# Patient Record
Sex: Female | Born: 1985
Health system: Southern US, Community
[De-identification: ages and names within clinical notes are randomized; demographics above are authoritative.]

## PROBLEM LIST (undated history)

## (undated) ENCOUNTER — Inpatient Hospital Stay (HOSPITAL_COMMUNITY): Payer: Self-pay

## (undated) DIAGNOSIS — Z6841 Body Mass Index (BMI) 40.0 and over, adult: Secondary | ICD-10-CM

## (undated) DIAGNOSIS — E282 Polycystic ovarian syndrome: Secondary | ICD-10-CM

## (undated) DIAGNOSIS — R51 Headache: Secondary | ICD-10-CM

## (undated) DIAGNOSIS — Z87442 Personal history of urinary calculi: Secondary | ICD-10-CM

## (undated) DIAGNOSIS — K219 Gastro-esophageal reflux disease without esophagitis: Secondary | ICD-10-CM

## (undated) DIAGNOSIS — K429 Umbilical hernia without obstruction or gangrene: Secondary | ICD-10-CM

## (undated) DIAGNOSIS — O24419 Gestational diabetes mellitus in pregnancy, unspecified control: Secondary | ICD-10-CM

## (undated) DIAGNOSIS — D649 Anemia, unspecified: Secondary | ICD-10-CM

## (undated) DIAGNOSIS — R519 Headache, unspecified: Secondary | ICD-10-CM

## (undated) DIAGNOSIS — Z8619 Personal history of other infectious and parasitic diseases: Secondary | ICD-10-CM

## (undated) DIAGNOSIS — M419 Scoliosis, unspecified: Secondary | ICD-10-CM

## (undated) DIAGNOSIS — Z8489 Family history of other specified conditions: Secondary | ICD-10-CM

## (undated) HISTORY — DX: Polycystic ovarian syndrome: E28.2

## (undated) HISTORY — DX: Personal history of other infectious and parasitic diseases: Z86.19

## (undated) HISTORY — DX: Gestational diabetes mellitus in pregnancy, unspecified control: O24.419

## (undated) HISTORY — PX: TONSILLECTOMY: SUR1361

## (undated) HISTORY — PX: WISDOM TOOTH EXTRACTION: SHX21

## (undated) HISTORY — DX: Morbid (severe) obesity due to excess calories: E66.01

## (undated) HISTORY — DX: Headache: R51

## (undated) HISTORY — DX: Headache, unspecified: R51.9

## (undated) HISTORY — DX: Scoliosis, unspecified: M41.9

## (undated) HISTORY — DX: Body Mass Index (BMI) 40.0 and over, adult: Z684

---

## 2015-03-20 LAB — OB RESULTS CONSOLE RUBELLA ANTIBODY, IGM: RUBELLA: IMMUNE

## 2015-03-20 LAB — OB RESULTS CONSOLE HEPATITIS B SURFACE ANTIGEN: Hepatitis B Surface Ag: NEGATIVE

## 2015-03-20 LAB — OB RESULTS CONSOLE ABO/RH: RH Type: POSITIVE

## 2015-03-20 LAB — OB RESULTS CONSOLE HIV ANTIBODY (ROUTINE TESTING): HIV: NONREACTIVE

## 2015-03-20 LAB — OB RESULTS CONSOLE GC/CHLAMYDIA
CHLAMYDIA, DNA PROBE: NEGATIVE
GC PROBE AMP, GENITAL: NEGATIVE

## 2015-03-20 LAB — OB RESULTS CONSOLE RPR: RPR: NONREACTIVE

## 2015-04-04 ENCOUNTER — Encounter (HOSPITAL_COMMUNITY): Payer: Self-pay | Admitting: *Deleted

## 2015-04-04 ENCOUNTER — Inpatient Hospital Stay (HOSPITAL_COMMUNITY)
Admission: AD | Admit: 2015-04-04 | Discharge: 2015-04-04 | Disposition: A | Discharge status: Home / Self Care | Payer: BLUE CROSS/BLUE SHIELD | Source: Ambulatory Visit | Attending: Obstetrics and Gynecology | Admitting: Obstetrics and Gynecology

## 2015-04-04 ENCOUNTER — Inpatient Hospital Stay (HOSPITAL_COMMUNITY): Payer: BLUE CROSS/BLUE SHIELD

## 2015-04-04 DIAGNOSIS — R55 Syncope and collapse: Secondary | ICD-10-CM | POA: Insufficient documentation

## 2015-04-04 DIAGNOSIS — R109 Unspecified abdominal pain: Secondary | ICD-10-CM

## 2015-04-04 DIAGNOSIS — Z3A09 9 weeks gestation of pregnancy: Secondary | ICD-10-CM | POA: Insufficient documentation

## 2015-04-04 DIAGNOSIS — R1031 Right lower quadrant pain: Secondary | ICD-10-CM | POA: Insufficient documentation

## 2015-04-04 DIAGNOSIS — O26891 Other specified pregnancy related conditions, first trimester: Secondary | ICD-10-CM | POA: Insufficient documentation

## 2015-04-04 DIAGNOSIS — O210 Mild hyperemesis gravidarum: Secondary | ICD-10-CM | POA: Diagnosis not present

## 2015-04-04 LAB — CBC
HCT: 41 % (ref 36.0–46.0)
Hemoglobin: 13.5 g/dL (ref 12.0–15.0)
MCH: 28.1 pg (ref 26.0–34.0)
MCHC: 32.9 g/dL (ref 30.0–36.0)
MCV: 85.4 fL (ref 78.0–100.0)
Platelets: 338 10*3/uL (ref 150–400)
RBC: 4.8 MIL/uL (ref 3.87–5.11)
RDW: 13.7 % (ref 11.5–15.5)
WBC: 7.5 10*3/uL (ref 4.0–10.5)

## 2015-04-04 LAB — COMPREHENSIVE METABOLIC PANEL
ALBUMIN: 3.8 g/dL (ref 3.5–5.0)
ALK PHOS: 66 U/L (ref 38–126)
ALT: 15 U/L (ref 14–54)
AST: 20 U/L (ref 15–41)
Anion gap: 11 (ref 5–15)
BILIRUBIN TOTAL: 0.4 mg/dL (ref 0.3–1.2)
BUN: 9 mg/dL (ref 6–20)
CO2: 22 mmol/L (ref 22–32)
CREATININE: 0.7 mg/dL (ref 0.44–1.00)
Calcium: 8.8 mg/dL — ABNORMAL LOW (ref 8.9–10.3)
Chloride: 101 mmol/L (ref 101–111)
GFR calc Af Amer: >60 mL/min (ref >60)
GLUCOSE: 106 mg/dL — AB (ref 65–99)
POTASSIUM: 3.4 mmol/L — AB (ref 3.5–5.1)
Sodium: 134 mmol/L — ABNORMAL LOW (ref 135–145)
TOTAL PROTEIN: 7.3 g/dL (ref 6.5–8.1)

## 2015-04-04 LAB — URINALYSIS, ROUTINE W REFLEX MICROSCOPIC
Bilirubin Urine: NEGATIVE
Glucose, UA: NEGATIVE mg/dL
Hgb urine dipstick: NEGATIVE
Ketones, ur: NEGATIVE mg/dL
LEUKOCYTES UA: NEGATIVE
Nitrite: NEGATIVE
PROTEIN: NEGATIVE mg/dL
pH: 6 (ref 5.0–8.0)

## 2015-04-04 LAB — ABO/RH: ABO/RH(D): A POS

## 2015-04-04 LAB — HCG, QUANTITATIVE, PREGNANCY: HCG, BETA CHAIN, QUANT, S: 54078 m[IU]/mL — AB (ref <5)

## 2015-04-04 LAB — TYPE AND SCREEN
ABO/RH(D): A POS
Antibody Screen: NEGATIVE

## 2015-04-04 MED ORDER — MORPHINE SULFATE (PF) 4 MG/ML IV SOLN
INTRAVENOUS | Status: AC
  Filled 2015-04-04: qty 1

## 2015-04-04 MED ORDER — LACTATED RINGERS IV BOLUS (SEPSIS)
500.0000 mL | Freq: Once | INTRAVENOUS | Status: AC
  Administered 2015-04-04: 500 mL via INTRAVENOUS

## 2015-04-04 MED ORDER — MORPHINE SULFATE (PF) 4 MG/ML IV SOLN
4.0000 mg | Freq: Once | INTRAVENOUS | Status: AC
  Administered 2015-04-04: 4 mg via INTRAVENOUS

## 2015-04-04 NOTE — Discharge Instructions (Signed)
First Trimester of Pregnancy The first trimester of pregnancy is from week 1 until the end of week 12 (months 1 through 3). A week after a sperm fertilizes an egg, the egg will implant on the wall of the uterus. This embryo will begin to develop into a baby. Genes from you and your partner are forming the baby. The female genes determine whether the baby is a boy or a girl. At 6-8 weeks, the eyes and face are formed, and the heartbeat can be seen on ultrasound. At the end of 12 weeks, all the baby's organs are formed.  Now that you are pregnant, you will want to do everything you can to have a healthy baby. Two of the most important things are to get good prenatal care and to follow your health care provider's instructions. Prenatal care is all the medical care you receive before the baby's birth. This care will help prevent, find, and treat any problems during the pregnancy and childbirth. BODY CHANGES Your body goes through many changes during pregnancy. The changes vary from woman to woman.   You may gain or lose a couple of pounds at first.  You may feel sick to your stomach (nauseous) and throw up (vomit). If the vomiting is uncontrollable, call your health care provider.  You may tire easily.  You may develop headaches that can be relieved by medicines approved by your health care provider.  You may urinate more often. Painful urination may mean you have a bladder infection.  You may develop heartburn as a result of your pregnancy.  You may develop constipation because certain hormones are causing the muscles that push waste through your intestines to slow down.  You may develop hemorrhoids or swollen, bulging veins (varicose veins).  Your breasts may begin to grow larger and become tender. Your nipples may stick out more, and the tissue that surrounds them (areola) may become darker.  Your gums may bleed and may be sensitive to brushing and flossing.  Dark spots or blotches (chloasma,  mask of pregnancy) may develop on your face. This will likely fade after the baby is born.  Your menstrual periods will stop.  You may have a loss of appetite.  You may develop cravings for certain kinds of food.  You may have changes in your emotions from day to day, such as being excited to be pregnant or being concerned that something may go wrong with the pregnancy and baby.  You may have more vivid and strange dreams.  You may have changes in your hair. These can include thickening of your hair, rapid growth, and changes in texture. Some women also have hair loss during or after pregnancy, or hair that feels dry or thin. Your hair will most likely return to normal after your baby is born. WHAT TO EXPECT AT YOUR PRENATAL VISITS During a routine prenatal visit:  You will be weighed to make sure you and the baby are growing normally.  Your blood pressure will be taken.  Your abdomen will be measured to track your baby's growth.  The fetal heartbeat will be listened to starting around week 10 or 12 of your pregnancy.  Test results from any previous visits will be discussed. Your health care provider may ask you:  How you are feeling.  If you are feeling the baby move.  If you have had any abnormal symptoms, such as leaking fluid, bleeding, severe headaches, or abdominal cramping.  If you are using any tobacco products,   including cigarettes, chewing tobacco, and electronic cigarettes.  If you have any questions. Other tests that may be performed during your first trimester include:  Blood tests to find your blood type and to check for the presence of any previous infections. They will also be used to check for low iron levels (anemia) and Rh antibodies. Later in the pregnancy, blood tests for diabetes will be done along with other tests if problems develop.  Urine tests to check for infections, diabetes, or protein in the urine.  An ultrasound to confirm the proper growth  and development of the baby.  An amniocentesis to check for possible genetic problems.  Fetal screens for spina bifida and Down syndrome.  You may need other tests to make sure you and the baby are doing well.  HIV (human immunodeficiency virus) testing. Routine prenatal testing includes screening for HIV, unless you choose not to have this test. HOME CARE INSTRUCTIONS  Medicines  Follow your health care provider's instructions regarding medicine use. Specific medicines may be either safe or unsafe to take during pregnancy.  Take your prenatal vitamins as directed.  If you develop constipation, try taking a stool softener if your health care provider approves. Diet  Eat regular, well-balanced meals. Choose a variety of foods, such as meat or vegetable-based protein, fish, milk and low-fat dairy products, vegetables, fruits, and whole grain breads and cereals. Your health care provider will help you determine the amount of weight gain that is right for you.  Avoid raw meat and uncooked cheese. These carry germs that can cause birth defects in the baby.  Eating four or five small meals rather than three large meals a day may help relieve nausea and vomiting. If you start to feel nauseous, eating a few soda crackers can be helpful. Drinking liquids between meals instead of during meals also seems to help nausea and vomiting.  If you develop constipation, eat more high-fiber foods, such as fresh vegetables or fruit and whole grains. Drink enough fluids to keep your urine clear or pale yellow. Activity and Exercise  Exercise only as directed by your health care provider. Exercising will help you:  Control your weight.  Stay in shape.  Be prepared for labor and delivery.  Experiencing pain or cramping in the lower abdomen or low back is a good sign that you should stop exercising. Check with your health care provider before continuing normal exercises.  Try to avoid standing for long  periods of time. Move your legs often if you must stand in one place for a long time.  Avoid heavy lifting.  Wear low-heeled shoes, and practice good posture.  You may continue to have sex unless your health care provider directs you otherwise. Relief of Pain or Discomfort  Wear a good support bra for breast tenderness.   Take warm sitz baths to soothe any pain or discomfort caused by hemorrhoids. Use hemorrhoid cream if your health care provider approves.   Rest with your legs elevated if you have leg cramps or low back pain.  If you develop varicose veins in your legs, wear support hose. Elevate your feet for 15 minutes, 3-4 times a day. Limit salt in your diet. Prenatal Care  Schedule your prenatal visits by the twelfth week of pregnancy. They are usually scheduled monthly at first, then more often in the last 2 months before delivery.  Write down your questions. Take them to your prenatal visits.  Keep all your prenatal visits as directed by your   health care provider. Safety  Wear your seat belt at all times when driving.  Make a list of emergency phone numbers, including numbers for family, friends, the hospital, and police and fire departments. General Tips  Ask your health care provider for a referral to a local prenatal education class. Begin classes no later than at the beginning of month 6 of your pregnancy.  Ask for help if you have counseling or nutritional needs during pregnancy. Your health care provider can offer advice or refer you to specialists for help with various needs.  Do not use hot tubs, steam rooms, or saunas.  Do not douche or use tampons or scented sanitary pads.  Do not cross your legs for long periods of time.  Avoid cat litter boxes and soil used by cats. These carry germs that can cause birth defects in the baby and possibly loss of the fetus by miscarriage or stillbirth.  Avoid all smoking, herbs, alcohol, and medicines not prescribed by  your health care provider. Chemicals in these affect the formation and growth of the baby.  Do not use any tobacco products, including cigarettes, chewing tobacco, and electronic cigarettes. If you need help quitting, ask your health care provider. You may receive counseling support and other resources to help you quit.  Schedule a dentist appointment. At home, brush your teeth with a soft toothbrush and be gentle when you floss. SEEK MEDICAL CARE IF:   You have dizziness.  You have mild pelvic cramps, pelvic pressure, or nagging pain in the abdominal area.  You have persistent nausea, vomiting, or diarrhea.  You have a bad smelling vaginal discharge.  You have pain with urination.  You notice increased swelling in your face, hands, legs, or ankles. SEEK IMMEDIATE MEDICAL CARE IF:   You have a fever.  You are leaking fluid from your vagina.  You have spotting or bleeding from your vagina.  You have severe abdominal cramping or pain.  You have rapid weight gain or loss.  You vomit blood or material that looks like coffee grounds.  You are exposed to German measles and have never had them.  You are exposed to fifth disease or chickenpox.  You develop a severe headache.  You have shortness of breath.  You have any kind of trauma, such as from a fall or a car accident.   This information is not intended to replace advice given to you by your health care provider. Make sure you discuss any questions you have with your health care provider.   Document Released: 03/31/2001 Document Revised: 04/27/2014 Document Reviewed: 02/14/2013 Elsevier Interactive Patient Education 2016 [employer]  

## 2015-04-04 NOTE — Progress Notes (Signed)
Notified of pt lab results and u/s result. Will come see pt

## 2015-04-04 NOTE — MAU Note (Addendum)
Thinks she is having appendix pain and needs to be checked out for umbilical hernia.  Has been having RLQ pain for the past 8 months prior to the preg.  Never had either dx.  Ongoing vomiting.  Had 2nd prenatal appointment today

## 2015-04-04 NOTE — MAU Provider Note (Signed)
History    Marie Flores is a 29y.o. G2P1001 at 9.5wks who presents, from office, for right abdominal pain.  Patient reports pain started yesterday and is intermittent, but does not radiate.  Patient reports diarrhea this afternoon as well as nausea and vomiting prior to arrival.  Patient states she has not taken anything for pain or N/V, but was given samples of Diclegis this morning. Patient states that she has suspected umbilical hernia, but stopped treatment with her PCP when she found out she was pregnant and was not formally diagnosed.  Patient reports pain is exaggerated with eating and relieved with laying on her left side.  Patient reports she last ate at 1330 yesterday and pain began a couple hours after.  Patient reports "slight fever" last night of 99 and did not treat it.  Patient denies contact with sick persons.    There are no active problems to display for this patient.   Chief Complaint  Patient presents with  . Abdominal Pain  . Morning Sickness   HPI  OB History    Gravida Para Term Preterm AB TAB SAB Ectopic Multiple Living   History reviewed. No pertinent past medical history.  Past Surgical History  Procedure Laterality Date  . Tonsillectomy    . Wisdom tooth extraction      History reviewed. No pertinent family history.  Social History  Substance Use Topics  . Smoking status: None  . Smokeless tobacco: None  . Alcohol Use: None    Allergies: No Known Allergies  No prescriptions prior to admission    ROS  See HPI Above Physical Exam   Blood pressure 120/60, pulse 99, temperature 98.4 F (36.9 C), temperature source Oral, resp. rate 20, height  (1.575 m), weight 95.709 kg (211 lb), last menstrual period 01/26/2015.  Results for orders placed or performed during the hospital encounter of 04/04/15 (from the past 24 hour(s))  Urinalysis, Routine w reflex microscopic (not at [facility])     Status: Abnormal   Collection  Time: 04/04/15  6:40 PM  Result Value Ref Range   Color, Urine YELLOW YELLOW   APPearance CLEAR CLEAR   Specific Gravity, Urine <1.005 (L) 1.005 - 1.030   pH 6.0 5.0 - 8.0   Glucose, UA NEGATIVE NEGATIVE mg/dL   Hgb urine dipstick NEGATIVE NEGATIVE   Bilirubin Urine NEGATIVE NEGATIVE   Ketones, ur NEGATIVE NEGATIVE mg/dL   Protein, ur NEGATIVE NEGATIVE mg/dL   Nitrite NEGATIVE NEGATIVE   Leukocytes, UA NEGATIVE NEGATIVE  CBC     Status: None   Collection Time: 04/04/15  7:52 PM  Result Value Ref Range   WBC 7.5 4.0 - 10.5 K/uL   RBC 4.80 3.87 - 5.11 MIL/uL   Hemoglobin 13.5 12.0 - 15.0 g/dL   HCT 16.1 09.6 - 04.5 %   MCV 85.4 78.0 - 100.0 fL   MCH 28.1 26.0 - 34.0 pg   MCHC 32.9 30.0 - 36.0 g/dL   RDW 40.9 81.1 - 91.4 %   Platelets 338 150 - 400 K/uL  Comprehensive metabolic panel     Status: Abnormal   Collection Time: 04/04/15  7:52 PM  Result Value Ref Range   Sodium 134 (L) 135 - 145 mmol/L   Potassium 3.4 (L) 3.5 - 5.1 mmol/L   Chloride 101 101 - 111 mmol/L   CO2 22 22 - 32 mmol/L   Glucose, Bld  106 (H) 65 - 99 mg/dL   BUN 9 6 - 20 mg/dL   Creatinine, Ser 4.690.70 0.44 - 1.00 mg/dL   Calcium 8.8 (L) 8.9 - 10.3 mg/dL   Total Protein 7.3 6.5 - 8.1 g/dL   Albumin 3.8 3.5 - 5.0 g/dL   AST 20 15 - 41 U/L   ALT 15 14 - 54 U/L   Alkaline Phosphatase 66 38 - 126 U/L   Total Bilirubin 0.4 0.3 - 1.2 mg/dL   GFR calc non Af Amer >60 >60 mL/min   GFR calc Af Amer >60 >60 mL/min   Anion gap 11 5 - 15  hCG, quantitative, pregnancy     Status: Abnormal   Collection Time: 04/04/15  7:52 PM  Result Value Ref Range   hCG, Beta Chain, Quant, S 54078 (H) <5 mIU/mL  Type and screen     Status: None   Collection Time: 04/04/15  7:52 PM  Result Value Ref Range   ABO/RH(D) A POS    Antibody Screen NEG    Sample Expiration 04/07/2015   ABO/Rh     Status: None   Collection Time: 04/04/15  7:52 PM  Result Value Ref Range   ABO/RH(D) A POS     Physical Exam  Vitals  reviewed. Constitutional: She is oriented to person, place, and time. She appears well-developed and well-nourished.  HENT:  Head: Normocephalic and atraumatic.  Eyes: EOM are normal.  Neck: Normal range of motion.  Cardiovascular: Normal rate.   Respiratory: Effort normal.  GI: Soft. She exhibits no distension. There is tenderness. There is no rebound and no guarding.  Small (<2cm) umbilical mass that is easily reducible.  No pain elicited.    Musculoskeletal: Normal range of motion. She exhibits no edema.  Neurological: She is alert and oriented to person, place, and time.  Skin: Skin is warm. She is diaphoretic.     FHR: 189 by US UC: None ED Course  Assessment: IUP at 9.5wks Right Side Abdominal Pain Syncope  Plan: -Start IV and draw labs -Dr. Katharine LookJ. Ozan consulted and advised -Labs: CBC, CMP, UA, and HcG -US to confirm IUP -Morphine IV 4mg  now  Follow Up (2155) -US confirms SIUP consistent with LMP, No SCH, Ovaries WNL, FHR 189 -Patient eating crackers and drinking fluids that she brought from home -Dr. Katharine LookJ. Ozan consulted and advised discharge -Patient informed of results -Questions and concerns addressed--informed that questionable hernia is small/mild and will be observed -Further informed that labs insignificant for infection including appendicitis. -Follow up in office to be scheduled  -Encouraged to call if any questions or concerns arise prior to next scheduled office visit.  -Discharged to home in improved condition   Cherre RobinsJessica L Aubreyana Saltz CNM, MSN 04/04/2015 8:47 PM

## 2015-04-04 NOTE — MAU Note (Signed)
PT  WAS IN LOBBY   WAITING  FOR    ROOM-  HAD  SUDDEN  PAIN -  THEN FELT  WEAK  AND  PASSED OUT  IN LOBBY  CHAIR.     WITH  AMONIA  -  ALERT-  BROUGHT   TO RM  10.

## 2015-04-04 NOTE — MAU Note (Signed)
Urine in lab 

## 2015-04-21 NOTE — L&D Delivery Note (Signed)
Delivery Note  At 11:04 AM Flores viable female named Marie Flores was delivered via Vaginal, Spontaneous Delivery (Presentation: ; Occiput Anterior) [facility] APGAR: 9, 9  Placenta status: Intact, Spontaneous.  Cord: 3 vessels  Anesthesia: Epidural  Episiotomy: None Lacerations: 2nd degree Suture Repair: 3.0 Monocryl Est. Blood Loss (mL): 400  Mom to postpartum.  Baby to Couplet care / Skin to Skin.  Marie Flores 11/02/2015, 12:11 PM

## 2015-06-21 ENCOUNTER — Other Ambulatory Visit (HOSPITAL_COMMUNITY): Payer: Self-pay | Admitting: Obstetrics and Gynecology

## 2015-06-21 DIAGNOSIS — R1011 Right upper quadrant pain: Secondary | ICD-10-CM

## 2015-06-24 ENCOUNTER — Ambulatory Visit (HOSPITAL_COMMUNITY)
Admission: RE | Admit: 2015-06-24 | Discharge: 2015-06-24 | Disposition: A | Discharge status: Home / Self Care | Payer: BLUE CROSS/BLUE SHIELD | Source: Ambulatory Visit | Attending: Obstetrics and Gynecology | Admitting: Obstetrics and Gynecology

## 2015-06-24 DIAGNOSIS — R1011 Right upper quadrant pain: Secondary | ICD-10-CM | POA: Insufficient documentation

## 2015-09-02 ENCOUNTER — Encounter: Payer: BLUE CROSS/BLUE SHIELD | Attending: Advanced Practice Midwife | Admitting: Skilled Nursing Facility1

## 2015-09-02 VITALS — Ht 62.0 in | Wt 232.0 lb

## 2015-09-02 DIAGNOSIS — O24419 Gestational diabetes mellitus in pregnancy, unspecified control: Secondary | ICD-10-CM | POA: Diagnosis present

## 2015-09-02 DIAGNOSIS — Z3A00 Weeks of gestation of pregnancy not specified: Secondary | ICD-10-CM | POA: Insufficient documentation

## 2015-09-02 DIAGNOSIS — O24410 Gestational diabetes mellitus in pregnancy, diet controlled: Secondary | ICD-10-CM

## 2015-09-02 DIAGNOSIS — O99810 Abnormal glucose complicating pregnancy: Secondary | ICD-10-CM | POA: Diagnosis not present

## 2015-09-04 ENCOUNTER — Encounter: Payer: Self-pay | Admitting: Skilled Nursing Facility1

## 2015-09-04 NOTE — Progress Notes (Signed)
  Patient was seen on 09/02/2015 for Gestational Diabetes self-management class at [facility]. The following learning objectives were met by the patient during this course:   States the definition of Gestational Diabetes  States why dietary management is important in controlling blood glucose  Describes the effects each nutrient has on blood glucose levels  Demonstrates ability to create a balanced meal plan  Demonstrates carbohydrate counting   States when to check blood glucose levels  Demonstrates proper blood glucose monitoring techniques  States the effect of stress and exercise on blood glucose levels  States the importance of limiting caffeine and abstaining from alcohol and smoking  Blood glucose monitor given: One Touch Verio Flex Lot # [id number] X Exp: 06/2016 Blood glucose reading: 85  Patient instructed to monitor glucose levels: FBS: 60 - <90 1 hour: <140 2 hour: <120  *Patient received handouts:  Nutrition Diabetes and Pregnancy  Carbohydrate Counting List  Patient will be seen for follow-up as needed.

## 2015-10-07 LAB — OB RESULTS CONSOLE GBS: STREP GROUP B AG: POSITIVE

## 2015-10-30 ENCOUNTER — Encounter (HOSPITAL_COMMUNITY): Payer: Self-pay | Admitting: *Deleted

## 2015-10-30 ENCOUNTER — Telehealth (HOSPITAL_COMMUNITY): Payer: Self-pay | Admitting: *Deleted

## 2015-10-30 NOTE — Telephone Encounter (Signed)
Preadmission screen  

## 2015-10-31 ENCOUNTER — Other Ambulatory Visit: Payer: Self-pay | Admitting: Obstetrics and Gynecology

## 2015-11-02 ENCOUNTER — Inpatient Hospital Stay (HOSPITAL_COMMUNITY): Payer: BLUE CROSS/BLUE SHIELD | Admitting: Anesthesiology

## 2015-11-02 ENCOUNTER — Encounter (HOSPITAL_COMMUNITY): Payer: Self-pay | Admitting: Emergency Medicine

## 2015-11-02 ENCOUNTER — Inpatient Hospital Stay (HOSPITAL_COMMUNITY)
Admission: AD | Admit: 2015-11-02 | Discharge: 2015-11-04 | DRG: 775 | Disposition: A | Discharge status: Home / Self Care | Payer: BLUE CROSS/BLUE SHIELD | Source: Ambulatory Visit | Attending: Obstetrics and Gynecology | Admitting: Obstetrics and Gynecology

## 2015-11-02 ENCOUNTER — Inpatient Hospital Stay (HOSPITAL_COMMUNITY): Admission: RE | Admit: 2015-11-02 | Payer: BLUE CROSS/BLUE SHIELD | Source: Ambulatory Visit

## 2015-11-02 DIAGNOSIS — M419 Scoliosis, unspecified: Secondary | ICD-10-CM | POA: Diagnosis present

## 2015-11-02 DIAGNOSIS — O99824 Streptococcus B carrier state complicating childbirth: Secondary | ICD-10-CM | POA: Diagnosis present

## 2015-11-02 DIAGNOSIS — Z3A40 40 weeks gestation of pregnancy: Secondary | ICD-10-CM

## 2015-11-02 DIAGNOSIS — K219 Gastro-esophageal reflux disease without esophagitis: Secondary | ICD-10-CM | POA: Diagnosis present

## 2015-11-02 DIAGNOSIS — O9962 Diseases of the digestive system complicating childbirth: Secondary | ICD-10-CM | POA: Diagnosis present

## 2015-11-02 DIAGNOSIS — O24425 Gestational diabetes mellitus in childbirth, controlled by oral hypoglycemic drugs: Secondary | ICD-10-CM | POA: Diagnosis present

## 2015-11-02 DIAGNOSIS — D649 Anemia, unspecified: Secondary | ICD-10-CM | POA: Diagnosis present

## 2015-11-02 DIAGNOSIS — Z880 Allergy status to penicillin: Secondary | ICD-10-CM | POA: Diagnosis not present

## 2015-11-02 DIAGNOSIS — Z6841 Body Mass Index (BMI) 40.0 and over, adult: Secondary | ICD-10-CM

## 2015-11-02 DIAGNOSIS — Z833 Family history of diabetes mellitus: Secondary | ICD-10-CM

## 2015-11-02 DIAGNOSIS — O99214 Obesity complicating childbirth: Secondary | ICD-10-CM | POA: Diagnosis present

## 2015-11-02 DIAGNOSIS — Z823 Family history of stroke: Secondary | ICD-10-CM | POA: Diagnosis not present

## 2015-11-02 DIAGNOSIS — O9989 Other specified diseases and conditions complicating pregnancy, childbirth and the puerperium: Secondary | ICD-10-CM | POA: Diagnosis present

## 2015-11-02 DIAGNOSIS — Z825 Family history of asthma and other chronic lower respiratory diseases: Secondary | ICD-10-CM | POA: Diagnosis not present

## 2015-11-02 DIAGNOSIS — O24419 Gestational diabetes mellitus in pregnancy, unspecified control: Secondary | ICD-10-CM | POA: Diagnosis present

## 2015-11-02 DIAGNOSIS — O9902 Anemia complicating childbirth: Secondary | ICD-10-CM | POA: Diagnosis present

## 2015-11-02 LAB — CBC
HEMATOCRIT: 40.7 % (ref 36.0–46.0)
HEMOGLOBIN: 14 g/dL (ref 12.0–15.0)
MCH: 28.9 pg (ref 26.0–34.0)
MCHC: 34.4 g/dL (ref 30.0–36.0)
MCV: 84.1 fL (ref 78.0–100.0)
Platelets: 275 10*3/uL (ref 150–400)
RBC: 4.84 MIL/uL (ref 3.87–5.11)
RDW: 14.6 % (ref 11.5–15.5)
WBC: 14.5 10*3/uL — AB (ref 4.0–10.5)

## 2015-11-02 LAB — TYPE AND SCREEN
ABO/RH(D): A POS
Antibody Screen: NEGATIVE

## 2015-11-02 LAB — GLUCOSE, CAPILLARY
GLUCOSE-CAPILLARY: 80 mg/dL (ref 65–99)
GLUCOSE-CAPILLARY: 98 mg/dL (ref 65–99)
Glucose-Capillary: 93 mg/dL (ref 65–99)

## 2015-11-02 LAB — RPR: RPR Ser Ql: NONREACTIVE

## 2015-11-02 MED ORDER — PHENYLEPHRINE 40 MCG/ML (10ML) SYRINGE FOR IV PUSH (FOR BLOOD PRESSURE SUPPORT)
80.0000 ug | PREFILLED_SYRINGE | INTRAVENOUS | Status: DC | PRN
  Filled 2015-11-02: qty 5

## 2015-11-02 MED ORDER — MEASLES, MUMPS & RUBELLA VAC ~~LOC~~ INJ: 0.5000 mL | INJECTION | Freq: Once | SUBCUTANEOUS | Status: DC

## 2015-11-02 MED ORDER — BENZOCAINE-MENTHOL 20-0.5 % EX AERO: 1.0000 "application " | INHALATION_SPRAY | CUTANEOUS | Status: DC | PRN

## 2015-11-02 MED ORDER — EPHEDRINE 5 MG/ML INJ
10.0000 mg | INTRAVENOUS | Status: DC | PRN
  Filled 2015-11-02: qty 2

## 2015-11-02 MED ORDER — DIBUCAINE 1 % RE OINT: 1.0000 "application " | TOPICAL_OINTMENT | RECTAL | Status: DC | PRN

## 2015-11-02 MED ORDER — LIDOCAINE HCL (PF) 1 % IJ SOLN
INTRAMUSCULAR | Status: DC | PRN
  Administered 2015-11-02: 6 mL
  Administered 2015-11-02 (×2): 6 mL via EPIDURAL
  Administered 2015-11-02: 4 mL via EPIDURAL
  Administered 2015-11-02: 6 mL via EPIDURAL

## 2015-11-02 MED ORDER — PHENYLEPHRINE 40 MCG/ML (10ML) SYRINGE FOR IV PUSH (FOR BLOOD PRESSURE SUPPORT)
80.0000 ug | PREFILLED_SYRINGE | INTRAVENOUS | Status: DC | PRN
  Filled 2015-11-02: qty 10
  Filled 2015-11-02: qty 5

## 2015-11-02 MED ORDER — MISOPROSTOL 25 MCG QUARTER TABLET
25.0000 ug | ORAL_TABLET | ORAL | Status: DC | PRN
  Filled 2015-11-02: qty 1

## 2015-11-02 MED ORDER — TETANUS-DIPHTH-ACELL PERTUSSIS 5-2.5-18.5 LF-MCG/0.5 IM SUSP: 0.5000 mL | Freq: Once | INTRAMUSCULAR | Status: DC

## 2015-11-02 MED ORDER — SOD CITRATE-CITRIC ACID 500-334 MG/5ML PO SOLN: 30.0000 mL | ORAL | Status: DC | PRN

## 2015-11-02 MED ORDER — TERBUTALINE SULFATE 1 MG/ML IJ SOLN
0.2500 mg | Freq: Once | INTRAMUSCULAR | Status: DC | PRN
  Filled 2015-11-02: qty 1

## 2015-11-02 MED ORDER — ONDANSETRON HCL 4 MG/2ML IJ SOLN: 4.0000 mg | INTRAMUSCULAR | Status: DC | PRN

## 2015-11-02 MED ORDER — MISOPROSTOL 50MCG HALF TABLET
50.0000 ug | ORAL_TABLET | Freq: Once | ORAL | Status: AC
  Administered 2015-11-02: 50 ug via ORAL
  Filled 2015-11-02: qty 0.5

## 2015-11-02 MED ORDER — OXYTOCIN BOLUS FROM INFUSION: 500.0000 mL | INTRAVENOUS | Status: DC

## 2015-11-02 MED ORDER — ZOLPIDEM TARTRATE 5 MG PO TABS
5.0000 mg | ORAL_TABLET | Freq: Every evening | ORAL | Status: DC | PRN
  Filled 2015-11-02: qty 1

## 2015-11-02 MED ORDER — COCONUT OIL OIL
1.0000 | TOPICAL_OIL | Status: DC | PRN
Start: 2015-11-02 — End: 2015-11-04

## 2015-11-02 MED ORDER — IBUPROFEN 600 MG PO TABS
600.0000 mg | ORAL_TABLET | Freq: Four times a day (QID) | ORAL | Status: DC
  Administered 2015-11-02 – 2015-11-04 (×7): 600 mg via ORAL
  Filled 2015-11-02 (×8): qty 1

## 2015-11-02 MED ORDER — LACTATED RINGERS IV SOLN: 500.0000 mL | INTRAVENOUS | Status: DC | PRN

## 2015-11-02 MED ORDER — PRENATAL MULTIVITAMIN CH
1.0000 | ORAL_TABLET | Freq: Every day | ORAL | Status: DC
  Filled 2015-11-02: qty 1

## 2015-11-02 MED ORDER — OXYTOCIN 40 UNITS IN LACTATED RINGERS INFUSION - SIMPLE MED: 1.0000 m[IU]/min | INTRAVENOUS | Status: DC

## 2015-11-02 MED ORDER — DIPHENHYDRAMINE HCL 50 MG/ML IJ SOLN: 12.5000 mg | INTRAMUSCULAR | Status: DC | PRN

## 2015-11-02 MED ORDER — LIDOCAINE HCL (PF) 1 % IJ SOLN
30.0000 mL | INTRAMUSCULAR | Status: AC | PRN
  Administered 2015-11-02: 30 mL via SUBCUTANEOUS
  Filled 2015-11-02: qty 30

## 2015-11-02 MED ORDER — FENTANYL CITRATE (PF) 100 MCG/2ML IJ SOLN
50.0000 ug | INTRAMUSCULAR | Status: DC | PRN
  Administered 2015-11-02: 100 ug via INTRAVENOUS
  Filled 2015-11-02 (×2): qty 2

## 2015-11-02 MED ORDER — SIMETHICONE 80 MG PO CHEW: 80.0000 mg | CHEWABLE_TABLET | ORAL | Status: DC | PRN

## 2015-11-02 MED ORDER — ACETAMINOPHEN 325 MG PO TABS: 650.0000 mg | ORAL_TABLET | ORAL | Status: DC | PRN

## 2015-11-02 MED ORDER — OXYTOCIN 40 UNITS IN LACTATED RINGERS INFUSION - SIMPLE MED
2.5000 [IU]/h | INTRAVENOUS | Status: DC
  Administered 2015-11-02: 2.5 [IU]/h via INTRAVENOUS
  Filled 2015-11-02: qty 1000

## 2015-11-02 MED ORDER — ONDANSETRON HCL 4 MG PO TABS: 4.0000 mg | ORAL_TABLET | ORAL | Status: DC | PRN

## 2015-11-02 MED ORDER — FLEET ENEMA 7-19 GM/118ML RE ENEM: 1.0000 | ENEMA | RECTAL | Status: DC | PRN

## 2015-11-02 MED ORDER — WITCH HAZEL-GLYCERIN EX PADS: 1.0000 "application " | MEDICATED_PAD | CUTANEOUS | Status: DC | PRN

## 2015-11-02 MED ORDER — FERROUS SULFATE 325 (65 FE) MG PO TABS
325.0000 mg | ORAL_TABLET | Freq: Two times a day (BID) | ORAL | Status: DC
  Filled 2015-11-02 (×3): qty 1

## 2015-11-02 MED ORDER — CLINDAMYCIN PHOSPHATE 900 MG/50ML IV SOLN
900.0000 mg | Freq: Three times a day (TID) | INTRAVENOUS | Status: DC
  Administered 2015-11-02: 900 mg via INTRAVENOUS
  Filled 2015-11-02 (×3): qty 50

## 2015-11-02 MED ORDER — ONDANSETRON HCL 4 MG/2ML IJ SOLN: 4.0000 mg | Freq: Four times a day (QID) | INTRAMUSCULAR | Status: DC | PRN

## 2015-11-02 MED ORDER — LACTATED RINGERS IV SOLN: 500.0000 mL | Freq: Once | INTRAVENOUS | Status: DC

## 2015-11-02 MED ORDER — ZOLPIDEM TARTRATE 5 MG PO TABS: 5.0000 mg | ORAL_TABLET | Freq: Every evening | ORAL | Status: DC | PRN

## 2015-11-02 MED ORDER — LACTATED RINGERS IV SOLN
500.0000 mL | Freq: Once | INTRAVENOUS | Status: AC
  Administered 2015-11-02: 500 mL via INTRAVENOUS

## 2015-11-02 MED ORDER — LACTATED RINGERS IV SOLN
INTRAVENOUS | Status: DC
  Administered 2015-11-02: 125 mL/h via INTRAVENOUS
  Administered 2015-11-02: 09:00:00 via INTRAVENOUS

## 2015-11-02 MED ORDER — FENTANYL 2.5 MCG/ML BUPIVACAINE 1/10 % EPIDURAL INFUSION (WH - ANES)
14.0000 mL/h | INTRAMUSCULAR | Status: DC | PRN
  Administered 2015-11-02 (×2): 14 mL/h via EPIDURAL
  Filled 2015-11-02: qty 125

## 2015-11-02 MED ORDER — DIPHENHYDRAMINE HCL 25 MG PO CAPS: 25.0000 mg | ORAL_CAPSULE | Freq: Four times a day (QID) | ORAL | Status: DC | PRN

## 2015-11-02 MED ORDER — SENNOSIDES-DOCUSATE SODIUM 8.6-50 MG PO TABS
2.0000 | ORAL_TABLET | ORAL | Status: DC
  Administered 2015-11-03 (×2): 2 via ORAL
  Filled 2015-11-02 (×2): qty 2

## 2015-11-02 NOTE — Anesthesia Pain Management Evaluation Note (Signed)
  CRNA Pain Management Visit Note  Patient: Marie Flores, 30 y.o., female  "Hello I am a member of the anesthesia team at [facility]. We have an anesthesia team available at all times to provide care throughout the hospital, including epidural management and anesthesia for C-section. I don't know your plan for the delivery whether it a natural birth, water birth, IV sedation, nitrous supplementation, doula or epidural, but we want to meet your pain goals."   1.Was your pain managed to your expectations on prior hospitalizations?   Yes   2.What is your expectation for pain management during this hospitalization?     Epidural  3.How can we help you reach that goal? epidural  Record the patient's initial score and the patient's pain goal.   Pain: 5  Pain Goal: 4 [facility] wants you to be able to say your pain was always managed very well.  Marie Flores 11/02/2015

## 2015-11-02 NOTE — Progress Notes (Signed)
I was called to stand by for delivery as patient had made rapid progress and patient's provider was en route to hospital. When I arrived in the room the baby was crowning. I performed an uncomplicated delivery. OA. No fetal heart rate decelerations noted. Cord clamped and cut after 1 minute. Pitocin started. At that time the patient's provider arrived and assumed care for management of the third stage.

## 2015-11-02 NOTE — Anesthesia Preprocedure Evaluation (Signed)
Anesthesia Evaluation  Patient identified by MRN, date of birth, ID band Patient awake    Reviewed: Allergy & Precautions, NPO status , Patient's Chart, lab work & pertinent test results  History of Anesthesia Complications Negative for: history of anesthetic complications  Airway Mallampati: IV  TM Distance: >3 FB Neck ROM: Full    Dental  (+) Dental Advisory Given   Pulmonary neg pulmonary ROS,    breath sounds clear to auscultation       Cardiovascular negative cardio ROS   Rhythm:Regular Rate:Normal     Neuro/Psych  Headaches,    GI/Hepatic Neg liver ROS, GERD  Poorly Controlled,  Endo/Other  diabetes, GestationalMorbid obesity  Renal/GU negative Renal ROS     Musculoskeletal scoliosis   Abdominal (+) + obese,   Peds  Hematology   Anesthesia Other Findings   Reproductive/Obstetrics (+) Pregnancy                             Anesthesia Physical Anesthesia Plan  ASA: II  Anesthesia Plan: Epidural   Post-op Pain Management:    Induction:   Airway Management Planned: Natural Airway  Additional Equipment:   Intra-op Plan:   Post-operative Plan:   Informed Consent: I have reviewed the patients History and Physical, chart, labs and discussed the procedure including the risks, benefits and alternatives for the proposed anesthesia with the patient or authorized representative who has indicated his/her understanding and acceptance.   Dental advisory given  Plan Discussed with:   Anesthesia Plan Comments: (Patient identified. Risks/Benefits/Options discussed with patient including but not limited to bleeding, infection, nerve damage, paralysis, failed block, incomplete pain control, headache, blood pressure changes, nausea, vomiting, reactions to medication both or allergic, itching and postpartum back pain. Confirmed with bedside nurse the patient's most recent platelet count.  Confirmed with patient that they are not currently taking any anticoagulation, have any bleeding history or any family history of bleeding disorders. Patient expressed understanding and wished to proceed. All questions were answered.  )        Anesthesia Quick Evaluation

## 2015-11-02 NOTE — H&P (Signed)
Sury Wilmer FloorLynn Burdo is a 30 y.o. female presenting for medical induction of labor @ [redacted] weeks gestation due to GDM-medication controlled -Glyburide 5mg  q HS  Pregnancy followed at [facility] since 9  weeks and remarkable for: GDM- medication-Glyburide  EFW- 6lbs 11 oz on 10/15/15  OB History    Gravida Para Term Preterm AB TAB SAB Ectopic Multiple Living   2 1 1       1      Past Medical History  Diagnosis Date  . Headache   . Hx of varicella   . Scoliosis   . Gestational diabetes     diet controlled   Past Surgical History  Procedure Laterality Date  . Tonsillectomy    . Wisdom tooth extraction      Family History:   family history includes Asthma in her mother; Diabetes in her mother; Hyperlipidemia in her father; Stroke in her paternal grandfather. Social History:    reports that she has never smoked. She has never used smokeless tobacco. She reports that she does not drink alcohol or use illicit drugs.   Prenatal labs: ABO, Rh: --/--/A POS, A POS (12/15 1952) Antibody: NEG (12/15 1952) Rubella:Immune RPR: Nonreactive (11/30 0000)  HBsAg: Negative (11/30 0000)  HIV: Non-reactive (11/30 0000)  GBS: Positive (06/19 0000)    Prenatal Transfer Tool  Maternal Diabetes: Yes:  Diabetes Type:  Insulin/Medication controlled Genetic Screening: Declined Maternal Ultrasounds/Referrals:  Fetal Ultrasounds or other Referrals:  None Maternal Substance Abuse:  No Significant Maternal Medications:  None Significant Maternal Lab Results: Lab values include: Group B Strep positive   Dilation: 1.5 Effacement (%): Thick Station: -2 Exam by:: katie forsell,rnc Temperature 98.6 F (37 C), temperature source Oral, height 5' 2.25" (1.581 m), weight 232 lb (105.235 kg), last menstrual period 01/26/2015.  General Appearance: Alert, appropriate appearance for age. No acute distress HEENT Exam: Grossly normal Chest/Respiratory Exam: Normal chest wall and respirations. Clear to  auscultation Cardiovascular Exam: Regular rate and rhythm. S1, S2, no murmur Gastrointestinal Exam: soft, non-tender, Uterus gravid with size compatible with GA, Vertex presentation by Leopold's maneuvers Psychiatric Exam: Alert and oriented, appropriate affect  ++++++++++++++++++++++++++++++++++++++++++++++++++++++++++++++++  Vaginal exam: 1/50/-2 VTX  Fetal tracings: Category 1 strip ++++++++++++++++++++++++++++++++++++++++++++++++++++++++++++++++   Assessment/Plan: Medical Induction- GDM-medication required  IUP @ 40+0 G2P1  Cytotec for cervical ripening Plan for pitocin, AROM Anticipate vaginal delivery   Lori Clemmons CNM 11/02/2015, 2:06 AM

## 2015-11-02 NOTE — Lactation Note (Signed)
This note was copied from a baby's chart. Lactation Consultation Note  Patient Name: Marie Flores JXBJY'NToday's Date: 11/02/2015 Reason for consult: Initial assessment  Baby 6 hours old. Mom reports that the baby nursed well earlier, and she is letting the baby sleep now. Mom states that her older child is about to arrive for a visit. Enc mom to call for assistance with latching as needed. Mom reports that her first child (son) was tongue tied, but she nursed him for 2.5 months. Mom given [facility] brochure, aware of OP/BFSG and LC phone line assistance after D/C.  Maternal Data Has patient been taught Hand Expression?: Yes (Per mom.) Does the patient have breastfeeding experience prior to this delivery?: Yes  Feeding Feeding Type: Breast Fed Length of feed: 20 min  LATCH Score/Interventions                      Lactation Tools Discussed/Used     Consult Status Consult Status: Follow-up Date: 11/03/15 Follow-up type: In-patient    Marie Flores, [name] 11/02/2015, 5:44 PM

## 2015-11-02 NOTE — Progress Notes (Addendum)
Marie Flores is a 30 y.o. G2P1001 at 4040w0dadmitted for induction of labor due to Gestational diabetes.medication controlled  Subjective:  Comfortable with  pt is planning on using pain relief options but has declined them to this point. she is uncomfortable with contractions Contractions every 2 minutes, lasting 30 seconds, intensity 4/10    Objective: BP 136/79 mmHg  Pulse 106  Temp(Src) 98.2 F (36.8 C) (Oral)  Resp 20  Ht 5' 2.25" (1.581 m)  Wt 232 lb (105.235 kg)  BMI 42.10 kg/m2  LMP 01/26/2015      FHT:  FHR: 130 bpm, variability: moderate,  accelerations:  Present,  decelerations:  Absent  SVE:   Dilation: 2 Effacement (%): 60 Station: -2 Exam by:: katie forsell,rnc  Labs: Lab Results  Component Value Date   WBC 14.5* 11/02/2015   HGB 14.0 11/02/2015   HCT 40.7 11/02/2015   MCV 84.1 11/02/2015   PLT 275 11/02/2015    Assessment / Plan: Induction of labor- GDM-Glyburide GBS Positive- Allergic to Amoxicillin Fetal Wellbeing: reassuring Unable to find sensitivites report- Will give Clindamycin 900mg  q 8 hours for GBS Prophylaxis Anticipated MOD:  NSVD  Lori A Clemmons 11/02/2015, 7:34 AM

## 2015-11-02 NOTE — Anesthesia Procedure Notes (Signed)
Epidural Patient location during procedure: OB Start time: 11/02/2015 9:00 AM End time: 11/02/2015 11:10 AM  Staffing Anesthesiologist: Jairo BenJACKSON, Karley Pho Performed by: anesthesiologist   Preanesthetic Checklist Completed: patient identified, surgical consent, pre-op evaluation, timeout performed, IV checked, risks and benefits discussed and monitors and equipment checked  Epidural Patient position: sitting Prep: site prepped and draped and DuraPrep Patient monitoring: blood pressure, continuous pulse ox and heart rate Approach: midline Location: L2-L3 Injection technique: LOR air  Needle:  Needle type: Tuohy  Needle gauge: 17 G Needle length: 9 cm Needle insertion depth: 7 cm Catheter type: closed end flexible Catheter size: 19 Gauge Catheter at skin depth: 12 cm Test dose: negative (1% lidocaine)  Additional Notes Pt identified in Labor room.  Monitors applied. Working IV access confirmed. Sterile prep, drape lumbar spine.  1% lido local L 2,3.  #17ga Touhy LOR air at 7 cm L 2,3, cath in easily to 12 cm skin. Test dose OK, cath dosed and infusion begun.  Patient asymptomatic, VSS, no heme aspirated, tolerated well.  Sandford Craze Yanett Conkright, MD  09:45 Unilateral block on R, R toes numb, tingly, no pain on R, pt c/o LLQ intense contraction, cath pulled back to 10.5 cm skin, redosed.  10:05 Pt still c/o R sided numbness, R Leg floppy, no relief of pain on L     Pt identified in Labor room.  Monitors applied. Working IV access confirmed. Sterile prep, drape lumbar spine.  1% lido local L 1,2.  #17ga Touhy LOR air at 7 cm L 1,2, cath in easily to 10.5 cm skin. Test dose OK, cath dosed and infusion continued.  Patient asymptomatic, VSS, no heme aspirated, tolerated well.  Sandford Craze Karianne Nogueira, MD 10:45  Pt with persistent R numbness, intense pain on L.  Discussed with pt and baby's father, both understand unilateral block, request additional attmpt at epidural placement at another level.    Pt identified in  Labor room.  Monitors applied. Working IV access confirmed. Sterile prep, drape lumbar spine.  1% lido local L 3,4.  #17ga Touhy LOR air at 7 cm L 3,4, cath in easily to 10 cm skin. Test dose OK, cath dosed and infusion continued.  Patient asymptomatic, VSS, no heme aspirated, tolerated well.  Sandford Craze Bettyjo Lundblad, MD    Reason for block:procedure for pain

## 2015-11-03 LAB — CBC
HCT: 34.7 % — ABNORMAL LOW (ref 36.0–46.0)
Hemoglobin: 11.5 g/dL — ABNORMAL LOW (ref 12.0–15.0)
MCH: 28.5 pg (ref 26.0–34.0)
MCHC: 33.1 g/dL (ref 30.0–36.0)
MCV: 86.1 fL (ref 78.0–100.0)
Platelets: 230 10*3/uL (ref 150–400)
RBC: 4.03 MIL/uL (ref 3.87–5.11)
RDW: 14.9 % (ref 11.5–15.5)
WBC: 14.3 10*3/uL — ABNORMAL HIGH (ref 4.0–10.5)

## 2015-11-03 LAB — GLUCOSE, CAPILLARY
Glucose-Capillary: 79 mg/dL (ref 65–99)
Glucose-Capillary: 106 mg/dL — ABNORMAL HIGH (ref 65–99)
Glucose-Capillary: 70 mg/dL (ref 65–99)
Glucose-Capillary: 97 mg/dL (ref 65–99)

## 2015-11-03 NOTE — Lactation Note (Signed)
This note was copied from a baby's chart. Lactation Consultation Note  Patient Name: Marie Flores YNWGN'FToday's Date: 11/03/2015 Reason for consult: Follow-up assessment  Baby 30 hours old. Mom reports that baby has been nursing a lot, and she is hearing swallows at breast. Asked mom if she is seeing an increase in colostrum. Mom reported that she had not hand expressed to check, but she was thinking about expressing into a spoon to see if her milk is transitioning. Left spoons at bedside for mom. Mom has lots of family in room and dinner trays just arrived. Enc mom to call for assistance as needed.  Maternal Data    Feeding Feeding Type: Breast Fed Length of feed: 30 min  LATCH Score/Interventions Latch: Repeated attempts needed to sustain latch, nipple held in mouth throughout feeding, stimulation needed to elicit sucking reflex. Intervention(s): Skin to skin;Teach feeding cues;Waking techniques Intervention(s): Adjust position;Assist with latch;Breast compression  Audible Swallowing: A few with stimulation Intervention(s): Skin to skin;Hand expression Intervention(s): Skin to skin;Hand expression  Type of Nipple: Everted at rest and after stimulation  Comfort (Breast/Nipple): Soft / non-tender     Hold (Positioning): No assistance needed to correctly position infant at breast. Intervention(s): Breastfeeding basics reviewed;Support Pillows;Position options;Skin to skin  LATCH Score: 8  Lactation Tools Discussed/Used     Consult Status Consult Status: Follow-up Date: 11/04/15 Follow-up type: In-patient    Geralynn OchsWILLIARD, Marie Flores 11/03/2015, 5:50 PM

## 2015-11-03 NOTE — Anesthesia Postprocedure Evaluation (Signed)
Anesthesia Post Note  Patient: Marie Flores  Procedure(s) Performed: * No procedures listed *  Patient location during evaluation: Mother Baby Anesthesia Type: Epidural Level of consciousness: awake and alert and oriented Pain management: satisfactory to patient Vital Signs Assessment: post-procedure vital signs reviewed and stable Respiratory status: spontaneous breathing and nonlabored ventilation Cardiovascular status: stable Postop Assessment: no headache, no backache, no signs of nausea or vomiting, adequate PO intake and patient able to bend at knees (patient up walking) Anesthetic complications: no     Last Vitals:  Filed Vitals:   11/02/15 2200 11/03/15 0500  BP: 110/76 109/62  Pulse: 91 85  Temp: 37.1 C 36.8 C  Resp: 20 20    Last Pain:  Filed Vitals:   11/03/15 0939  PainSc: 0-No pain   Pain Goal: Patients Stated Pain Goal: 0 (11/03/15 0530)               Madison HickmanGREGORY,[name]

## 2015-11-03 NOTE — Progress Notes (Signed)
Post Partum Day 1 Subjective: no complaints, up ad lib and tolerating PO  Objective: Blood pressure 109/62, pulse 85, temperature 98.2 F (36.8 C), temperature source Oral, resp. rate 20, height 5' 2.25" (1.581 m), weight 232 lb (105.235 kg), last menstrual period 01/26/2015, SpO2 99 %, unknown if currently breastfeeding.  Physical Exam:  General: alert and cooperative Lochia: appropriate Uterine Fundus: firm Incision: na DVT Evaluation: No evidence of DVT seen on physical exam.   Recent Labs  11/02/15 0055 11/03/15 0532  HGB 14.0 11.5*  HCT 40.7 34.7*    Assessment/Plan: Plan for discharge tomorrow and Breastfeeding  GDM BS 79-106 continue care   LOS: 1 day   Cedra Villalon A 11/03/2015, 3:26 PM

## 2015-11-04 LAB — GLUCOSE, CAPILLARY: GLUCOSE-CAPILLARY: 79 mg/dL (ref 65–99)

## 2015-11-04 MED ORDER — IBUPROFEN 800 MG PO TABS: 800.0000 mg | ORAL_TABLET | Freq: Three times a day (TID) | ORAL | Status: DC | PRN

## 2015-11-04 NOTE — Lactation Note (Signed)
This note was copied from a baby's chart. Lactation Consultation Note  Patient Name: Marie Flores Reason for consult: Follow-up assessment;Infant weight loss;Other (Comment) (7% weight loss )  Baby is 7147 hours old and for D/C today. Baby latched when LC walked in the room with depth. [facility] short upper lip frenulum. And her 1st baby had a tongue- tie and [place] the time [place] there was no MD that would clip it.  LC noted the baby was latched and sustained depth , multiply swallows noted and increased with breast compressions.  When baby  released, she was relaxed and seemed satisfied. Mom's nipple was slanted. Per mom breast are heavier , fuller and warmer  Today. LC reviewed sore nipple and engorgement prevention and tx. Per mom has a hand pump and DEBP at home.  LC instructed her on the use shells to enhance elongating the nipple areola complex to counteract the short labial frenulum.  Tongue - tie resources given to mom. Grandmother had mentioned when the MD was in the room this am examined the inside if the baby's mouth.  Mother informed of post-discharge support and given phone number to the lactation department, including services for phone call assistance; out-patient  appointments; and breastfeeding support group. List of other breastfeeding resources in the community given in the handout. Encouraged mother to call  for problems or concerns related to breastfeeding.   Maternal Data Has patient been taught Hand Expression?: Yes  Feeding Feeding Type:  (baby already latched. ) Length of feed: 20 min (baby  latched /  LC obs feeding , multiply swallows )  LATCH Score/Interventions Latch: Grasps breast easily, tongue down, lips flanged, rhythmical sucking. Intervention(s): Skin to skin;Teach feeding cues;Waking techniques Intervention(s): Adjust position;Assist with latch;Breast massage;Breast  compression  Audible Swallowing: Spontaneous and intermittent  Type of Nipple: Everted at rest and after stimulation  Comfort (Breast/Nipple): Filling, red/small blisters or bruises, mild/mod discomfort  Problem noted: Filling  Hold (Positioning): No assistance needed to correctly position infant at breast. Intervention(s): Breastfeeding basics reviewed;Support Pillows;Position options;Skin to skin  LATCH Score: 9  Lactation Tools Discussed/Used WIC Program: No   Consult Status Consult Status: Complete Date: 11/04/15    Marie Flores, Marie Flores Flores, 10:35 AM

## 2015-11-04 NOTE — Discharge Instructions (Signed)
Postpartum Depression and Baby Blues °The postpartum period begins right after the birth of a baby. During this time, there is often a great amount of joy and excitement. It is also a time of many changes in the life of the parents. Regardless of how many times a mother gives birth, each child brings new challenges and dynamics to the family. It is not unusual to have feelings of excitement along with confusing shifts in moods, emotions, and thoughts. All mothers are at risk of developing postpartum depression or the "baby blues." These mood changes can occur right after giving birth, or they may occur many months after giving birth. The baby blues or postpartum depression can be mild or severe. Additionally, postpartum depression can go away rather quickly, or it can be a long-term condition.  °CAUSES °Raised hormone levels and the rapid drop in those levels are thought to be a main cause of postpartum depression and the baby blues. A number of hormones change during and after pregnancy. Estrogen and progesterone usually decrease right after the delivery of your baby. The levels of thyroid hormone and various cortisol steroids also rapidly drop. Other factors that play a role in these mood changes include major life events and genetics.  °RISK FACTORS °If you have any of the following risks for the baby blues or postpartum depression, know what symptoms to watch out for during the postpartum period. Risk factors that may increase the likelihood of getting the baby blues or postpartum depression include: °· Having a personal or family history of depression.   °· Having depression while being pregnant.   °· Having premenstrual mood issues or mood issues related to oral contraceptives. °· Having a lot of life stress.   °· Having marital conflict.   °· Lacking a social support network.   °· Having a baby with special needs.   °· Having health problems, such as diabetes.   °SIGNS AND SYMPTOMS °Symptoms of baby blues  include: °· Brief changes in mood, such as going from extreme happiness to sadness. °· Decreased concentration.   °· Difficulty sleeping.   °· Crying spells, tearfulness.   °· Irritability.   °· Anxiety.   °Symptoms of postpartum depression typically begin within the first month after giving birth. These symptoms include: °· Difficulty sleeping or excessive sleepiness.   °· Marked weight loss.   °· Agitation.   °· Feelings of worthlessness.   °· Lack of interest in activity or food.   °Postpartum psychosis is a very serious condition and can be dangerous. Fortunately, it is rare. Displaying any of the following symptoms is cause for immediate medical attention. Symptoms of postpartum psychosis include:  °· Hallucinations and delusions.   °· Bizarre or disorganized behavior.   °· Confusion or disorientation.   °DIAGNOSIS  °A diagnosis is made by an evaluation of your symptoms. There are no medical or lab tests that lead to a diagnosis, but there are various questionnaires that a health care provider may use to identify those with the baby blues, postpartum depression, or psychosis. Often, a screening tool called the Edinburgh Postnatal Depression Scale is used to diagnose depression in the postpartum period.  °TREATMENT °The baby blues usually goes away on its own in 1-2 weeks. Social support is often all that is needed. You will be encouraged to get adequate sleep and rest. Occasionally, you may be given medicines to help you sleep.  °Postpartum depression requires treatment because it can last several months or longer if it is not treated. Treatment may include individual or group therapy, medicine, or both to address any social, physiological, and psychological   factors that may play a role in the depression. Regular exercise, a healthy diet, rest, and social support may also be strongly recommended.  °Postpartum psychosis is more serious and needs treatment right away. Hospitalization is often needed. °HOME CARE  INSTRUCTIONS °· Get as much rest as you can. Nap when the baby sleeps.   °· Exercise regularly. Some women find yoga and walking to be beneficial.   °· Eat a balanced and nourishing diet.   °· Do little things that you enjoy. Have a cup of tea, take a bubble bath, read your favorite magazine, or listen to your favorite music. °· Avoid alcohol.   °· Ask for help with household chores, cooking, grocery shopping, or running errands as needed. Do not try to do everything.   °· Talk to people close to you about how you are feeling. Get support from your partner, family members, friends, or other new moms. °· Try to stay positive in how you think. Think about the things you are grateful for.   °· Do not spend a lot of time alone.   °· Only take over-the-counter or prescription medicine as directed by your health care provider. °· Keep all your postpartum appointments.   °· Let your health care provider know if you have any concerns.   °SEEK MEDICAL CARE IF: °You are having a reaction to or problems with your medicine. °SEEK IMMEDIATE MEDICAL CARE IF: °· You have suicidal feelings.   °· You think you may harm the baby or someone else. °MAKE SURE YOU: °· Understand these instructions. °· Will watch your condition. °· Will get help right away if you are not doing well or get worse. °  °This information is not intended to replace advice given to you by your health care provider. Make sure you discuss any questions you have with your health care provider. °  °Document Released: 01/09/2004 Document Revised: 04/11/2013 Document Reviewed: 01/16/2013 °Elsevier Interactive Patient Education ©2016 [employer] Inc[employer] °Postpartum Care After Vaginal Delivery °After you deliver your newborn (postpartum period), the usual stay in the hospital is 24-72 hours. If there were problems with your labor or delivery, or if you have other medical problems, you might be in the hospital longer.  °While you are in the hospital, you will receive help and  instructions on how to care for yourself and your newborn during the postpartum period.  °While you are in the hospital: °· Be sure to tell your nurses if you have pain or discomfort, as well as where you feel the pain and what makes the pain worse. °· If you had an incision made near your vagina (episiotomy) or if you had some tearing during delivery, the nurses may put ice packs on your episiotomy or tear. The ice packs may help to reduce the pain and swelling. °· If you are breastfeeding, you may feel uncomfortable contractions of your uterus for a couple of weeks. This is normal. The contractions help your uterus get back to normal size. °· It is normal to have some bleeding after delivery. °· For the first 1-3 days after delivery, the flow is red and the amount may be similar to a period. °· It is common for the flow to start and stop. °· In the first few days, you may pass some small clots. Let your nurses know if you begin to pass large clots or your flow increases. °· Do not  flush blood clots down the toilet before having the nurse look at them. °· During the next 3-10 days after delivery, your   flow should become more watery and pink or brown-tinged in color. °¨ Ten to fourteen days after delivery, your flow should be a small amount of yellowish-white discharge. °¨ The amount of your flow will decrease over the first few weeks after delivery. Your flow may stop in 6-8 weeks. Most women have had their flow stop by 12 weeks after delivery. °· You should change your sanitary pads frequently. °· Wash your hands thoroughly with soap and water for at least 20 seconds after changing pads, using the toilet, or before holding or feeding your newborn. °· You should feel like you need to empty your bladder within the first 6-8 hours after delivery. °· In case you become weak, lightheaded, or faint, call your nurse before you get out of bed for the first time and before you take a shower for the first time. °· Within  the first few days after delivery, your breasts may begin to feel tender and full. This is called engorgement. Breast tenderness usually goes away within 48-72 hours after engorgement occurs. You may also notice milk leaking from your breasts. If you are not breastfeeding, do not stimulate your breasts. Breast stimulation can make your breasts produce more milk. °· Spending as much time as possible with your newborn is very important. During this time, you and your newborn can feel close and get to know each other. Having your newborn stay in your room (rooming in) will help to strengthen the bond with your newborn.  It will give you time to get to know your newborn and become comfortable caring for your newborn. °· Your hormones change after delivery. Sometimes the hormone changes can temporarily cause you to feel sad or tearful. These feelings should not last more than a few days. If these feelings last longer than that, you should talk to your caregiver. °· If desired, talk to your caregiver about methods of family planning or contraception. °· Talk to your caregiver about immunizations. Your caregiver may want you to have the following immunizations before leaving the hospital: °¨ Tetanus, diphtheria, and pertussis (Tdap) or tetanus and diphtheria (Td) immunization. It is very important that you and your family (including grandparents) or others caring for your newborn are up-to-date with the Tdap or Td immunizations. The Tdap or Td immunization can help protect your newborn from getting ill. °¨ Rubella immunization. °¨ Varicella (chickenpox) immunization. °¨ Influenza immunization. You should receive this annual immunization if you did not receive the immunization during your pregnancy. °  °This information is not intended to replace advice given to you by your health care provider. Make sure you discuss any questions you have with your health care provider. °  °Document Released: 02/01/2007 Document Revised:  12/30/2011 Document Reviewed: 12/02/2011 °Elsevier Interactive Patient Education ©2016 Elsevier Inc. ° °

## 2015-11-04 NOTE — Discharge Summary (Signed)
[facility] Discharge Summary   Patient Name:   Marie Flores DOB:     1985/12/20 MRN:     [id number]  Date of Admission:   11/02/2015 Date of Discharge:  11/04/2015  Admitting diagnosis:    [redacted] week gestation  Gestational diabetes requiring glyburide 5 mg daily  Obesity  Anemia   Discharge diagnosis:    Same                               Post partum procedures: None  Type of Delivery:  Normal spontaneous vaginal delivery  Delivering Provider: Kathrynn Running       Faculty Practice physician for Dr. Dois Davenport Rivard  Date of Delivery:  11/02/2015  Newborn Data:    Live born female  Birth Weight: 7 lb 13.9 oz (3570 g) APGAR: 9, 9  Baby's Name:  Glyn Ade Feeding:   Breast Disposition:   home with mother  Complications:   None  Hospital course:      Induction of Labor With Vaginal Delivery   30 y.o. yo [id number] at [redacted]w[redacted]d was admitted to the hospital 11/02/2015 for induction of labor.  Indication for induction: Gestational diabetes requiring glyburide 5 mg daily..  Patient had an uncomplicated labor course as follows: Membrane Rupture Time/Date: 9:30 AM ,11/02/2015   Intrapartum Procedures: Episiotomy: None [1]                                         Lacerations:  2nd degree [3]  Patient had delivery of a Viable infant.  Information for the patient's newborn:  Denaisha, Swango Girl Collette [[id number]]  Delivery Method: Vaginal, Spontaneous Delivery (Filed from Delivery Summary)   11/02/2015  Details of delivery can be found in separate delivery note.  Patient had a routine postpartum course. Patient is discharged home 11/04/2015.   Physical Exam:   Filed Vitals:   11/02/15 2200 11/03/15 0500 11/03/15 1745 11/04/15 0506  BP: 110/76 109/62 118/65 129/67  Pulse: 91 85 80 78  Temp: 98.7 F (37.1 C) 98.2 F (36.8 C) 98.3 F (36.8 C) 98 F (36.7 C)  TempSrc: Oral Oral Oral Oral  Resp: Height:      Weight:      SpO2: 99%       General: alert and no distress Lochia: appropriate Uterine Fundus: firm Incision: N/A DVT Evaluation: No evidence of DVT seen on physical exam.  Labs: Lab Results  Component Value Date   WBC 14.3* 11/03/2015   HGB 11.5* 11/03/2015   HCT 34.7* 11/03/2015   MCV 86.1 11/03/2015   PLT 230 11/03/2015   CMP Latest Ref Rng 04/04/2015  Glucose 65 - 99 mg/dL 657(Q)  BUN 6 - 20 mg/dL 9  Creatinine 4.69 - 6.29 mg/dL 5.28  Sodium 413 - 244 mmol/L 134(L)  Potassium 3.5 - 5.1 mmol/L 3.4(L)  Chloride 101 - 111 mmol/L 101  CO2 22 - 32 mmol/L 22  Calcium 8.9 - 10.3 mg/dL 0.1(U)  Total Protein 6.5 - 8.1 g/dL 7.3  Total Bilirubin 0.3 - 1.2 mg/dL 0.4  Alkaline Phos 38 - 126 U/L 66  AST 15 - 41 U/L 20  ALT 14 - 54 U/L 15    Discharge instruction: per After Visit Summary and "Baby and Me Booklet".  After Visit Meds:  Medication List    TAKE these medications        ibuprofen 800 MG tablet  Commonly known as:  ADVIL,MOTRIN  Take 1 tablet (800 mg total) by mouth every 8 (eight) hours as needed.     multivitamin-prenatal 27-0.8 MG Tabs tablet  Take 1 tablet by mouth daily.        Diet: carb modified diet  Activity: Advance as tolerated. Pelvic rest for 6 weeks.   Outpatient follow up:6 weeks Follow up Appt:No future appointments. Follow up visit: No Follow-up on file.  [facility]partum [facility]: [facility]  11/04/2015 Janine LimboSTRINGER,Bayley Yarborough V, MD

## 2016-02-08 ENCOUNTER — Emergency Department (HOSPITAL_BASED_OUTPATIENT_CLINIC_OR_DEPARTMENT_OTHER)
Admission: EM | Admit: 2016-02-08 | Discharge: 2016-02-09 | Disposition: A | Discharge status: Home / Self Care | Payer: BLUE CROSS/BLUE SHIELD | Attending: Emergency Medicine | Admitting: Emergency Medicine

## 2016-02-08 ENCOUNTER — Encounter (HOSPITAL_BASED_OUTPATIENT_CLINIC_OR_DEPARTMENT_OTHER): Payer: Self-pay | Admitting: Emergency Medicine

## 2016-02-08 DIAGNOSIS — H9209 Otalgia, unspecified ear: Secondary | ICD-10-CM | POA: Diagnosis not present

## 2016-02-08 DIAGNOSIS — J339 Nasal polyp, unspecified: Secondary | ICD-10-CM | POA: Diagnosis present

## 2016-02-08 NOTE — ED Triage Notes (Signed)
Pt in c/o nasal polyp x 1 week and a severe nose bleed yesterday. Pt denies bleeding at this time. Pt is alert, interactive, ambulatory in NAD.

## 2016-02-08 NOTE — Discharge Instructions (Signed)
Call Dr. Jearld FentonByers office Monday morning to schedule appointment to be seen to have your nose reevaluated. Return immediately to the emergency department if you experience fever, uncontrolled nosebleed, difficulty breathing, nausea, vomiting, or any other concerning symptoms.

## 2016-02-08 NOTE — ED Provider Notes (Signed)
[facility] Provider Note   CSN: [id number] Arrival date & time: 02/08/16  2129   By signing my name below, I, Marie Flores, attest that this documentation has been prepared under the direction and in the presence of Mattie MarlinJessica Sagrario [place], [place]. Electronically Signed: Valentino SaxonBianca Flores, ED Scribe. 02/08/16. 12:35 AM.  History   Chief Complaint Chief Complaint  Patient presents with  . Nasal Polyps  . Epistaxis   The history is provided by the patient. No language interpreter was used.    HPI Comments: Marie Flores FloorLynn Mentel is a 30 y.o. female who presents to the Emergency Department complaining of nasal mass onset years ago. Pt notes having a nasal polyp for awhile now but has noticed enlargement over the past three days. She reports associated epistaxis onset yesterday.Pt also reports having intermittent rhinorrhea, sinus congestion, ear pain, and  Mild intermittent Ha. She notes taking ibuprofen with significant relief. She denies cough, sneezing, nausea, vomiting. No additional complaints at this time. Pt notes she is currently breastfeeding. Pt has Seasonal allergies worse in the fall and she is not currently taking anything for them.   Past Medical History:  Diagnosis Date  . Gestational diabetes    diet controlled  . Headache   . Hx of varicella   . Scoliosis     Patient Active Problem List   Diagnosis Date Noted  . Gestational diabetes mellitus (GDM) affecting pregnancy 11/02/2015  . Vaginal delivery 11/02/2015    Past Surgical History:  Procedure Laterality Date  . TONSILLECTOMY    . WISDOM TOOTH EXTRACTION      OB History    Gravida Para Term Preterm AB Living   2 2 2     2    SAB TAB Ectopic Multiple Live Births         0 2       Home Medications    Prior to Admission medications   Medication Sig Start Date End Date Taking? Authorizing Provider  ibuprofen (ADVIL,MOTRIN) 800 MG tablet Take 1 tablet (800 mg total) by mouth every 8 (eight)  hours as needed. 11/04/15   Kirkland HunArthur Stringer, MD  Prenatal Vit-Fe Fumarate-FA (MULTIVITAMIN-PRENATAL) 27-0.8 MG TABS tablet Take 1 tablet by mouth daily.     Historical Provider, MD    Family History Family History  Problem Relation Age of Onset  . Asthma Mother   . Diabetes Mother   . Hyperlipidemia Father   . Stroke Paternal Grandfather     Social History Social History  Substance Use Topics  . Smoking status: Never Smoker  . Smokeless tobacco: Never Used  . Alcohol use No     Allergies   Amoxicillin   Review of Systems Review of Systems  HENT: Positive for congestion (sinus), ear pain, nosebleeds and rhinorrhea. Negative for sneezing.   Respiratory: Negative for cough.   Gastrointestinal: Negative for nausea and vomiting.  Neurological: Positive for headaches.     Physical Exam Updated Vital Signs BP 136/77   Pulse 92   Temp 98.2 F (36.8 C)   Resp 18   Ht 5\' 2"  (1.575 m)   Wt 200 lb (90.7 kg)   SpO2 98%   BMI 36.58 kg/m   Physical Exam  Constitutional: She appears well-developed and well-nourished. No distress.  HENT:  Head: Normocephalic and atraumatic.  Right Ear: Tympanic membrane, external ear and ear canal normal.  Left Ear: Tympanic membrane, external ear and ear canal normal.  Nose: Mucosal edema and rhinorrhea present. No epistaxis.  Mouth/Throat: Uvula is midline, oropharynx is clear and moist and mucous membranes are normal. No trismus in the jaw. No uvula swelling.  Small <1cm flesh colored mass with yellow crust noted in right nare, no bleeding  Eyes: Conjunctivae are normal.  Cardiovascular: Normal rate, regular rhythm and normal heart sounds.   Pulmonary/Chest: Effort normal. No respiratory distress.  Musculoskeletal: Normal range of motion.  Neurological: She is alert. Coordination normal.  Skin: Skin is warm and dry. She is not diaphoretic.  Psychiatric: She has a normal mood and affect. Her behavior is normal.  Nursing note and vitals  reviewed.    ED Treatments / Results   DIAGNOSTIC STUDIES: Oxygen Saturation is 98% on RA, normal by my interpretation.    COORDINATION OF CARE: 11:47 PM Discussed treatment plan with pt at bedside and pt agreed to plan.   Labs (all labs ordered are listed, but only abnormal results are displayed) Labs Reviewed - No data to display  EKG  EKG Interpretation None       Radiology No results found.  Procedures Procedures (including critical care time)  Medications Ordered in ED Medications - No data to display   Initial Impression / Assessment and Plan / ED Course  I have reviewed the triage vital signs and the nursing notes.  Pertinent labs & imaging results that were available during my care of the patient were reviewed by me and considered in my medical decision making (see chart for details).  Clinical Course   Patient with exam concerning for nasal polyp. This is chronic in nature but patient states it has gotten slightly larger over the last 3 days. Patient with epistasis yesterday but no epistasis today. Patient is currently breast-feeding and was concerned that her polyp might be infected and somehow she would pass his infection onto her baby. Nasal mass does not appear to be infected and patient is afebrile, VSS. Instructed patient to follow-up with ENT on Monday to schedule appointment to be seen next week. Discussed strict return precautions the ED. Patient expressed understanding to the discharge instructions.  Final Clinical Impressions(s) / ED Diagnoses   Final diagnoses:  Nasal polyp    New Prescriptions Discharge Medication List as of 02/08/2016 11:53 PM     I personally performed the services described in this documentation, which was scribed in my presence. The recorded information has been reviewed and is accurate.       Jerre Simon, PA 02/09/16 0038    Maia Plan, MD 02/09/16 [id number]

## 2016-02-08 NOTE — ED Notes (Addendum)
States" I think I have a infected polyp in my right side of my nose and I have been having nose bleeds" No Bleeding noted at present . Is presently breast feeding.

## 2016-02-19 LAB — HM PAP SMEAR: HM PAP: NORMAL

## 2017-07-09 ENCOUNTER — Ambulatory Visit (INDEPENDENT_AMBULATORY_CARE_PROVIDER_SITE_OTHER): Payer: BLUE CROSS/BLUE SHIELD | Admitting: Primary Care

## 2017-07-09 ENCOUNTER — Encounter: Payer: Self-pay | Admitting: Primary Care

## 2017-07-09 VITALS — BP 110/74 | HR 98 | Temp 98.5°F | Ht 61.5 in | Wt 230.0 lb

## 2017-07-09 DIAGNOSIS — K429 Umbilical hernia without obstruction or gangrene: Secondary | ICD-10-CM

## 2017-07-09 NOTE — Patient Instructions (Signed)
You will be contacted regarding your referral to [facility].  Please let us know if you have not been contacted within one week.   Please schedule a physical later this year. You may also schedule a lab only appointment 3-4 days prior. We will discuss your lab results in detail during your physical.  It was a pleasure to meet you today! Please don't hesitate to call or message me with any questions. Welcome to [facility]!

## 2017-07-09 NOTE — Progress Notes (Signed)
Subjective:    Patient ID: Marie Flores, female    DOB: 11/03/1985, 32 y.o.   MRN: [id number]  HPI  Marie Flores is a 32 year old female who presents today to establish care and discuss the problems mentioned below. Will obtain old records.  1) Umbilical Hernia: First noticed it after her first pregnancy in 2014, slightly bothersome afterward. Hernia become worse during her second pregnancy in 2017. Now she's experiencing nausea, abdominal pain to the peri-umbilical region that has progressed since 2017. She's had her hernia reduced in the past during her second pregnancy, but is no longer able to reduce it due to discomfort.   She's tried to work on weight loss through diet and exercise, but her diet is limited due to nausea and abdominal pain.   Review of Systems  Constitutional: Negative for fever.  Respiratory: Negative for shortness of breath.   Cardiovascular: Negative for chest pain.  Gastrointestinal: Positive for abdominal pain and nausea. Negative for blood in stool, constipation, diarrhea and vomiting.       Past Medical History:  Diagnosis Date  . Gestational diabetes    diet controlled  . Headache   . Hx of varicella   . Scoliosis      Social History   Socioeconomic History  . Marital status: Married    Spouse name: Not on file  . Number of children: Not on file  . Years of education: Not on file  . Highest education level: Not on file  Occupational History  . Not on file  Social Needs  . Financial resource strain: Not on file  . Food insecurity:    Worry: Not on file    Inability: Not on file  . Transportation needs:    Medical: Not on file    Non-medical: Not on file  Tobacco Use  . Smoking status: Never Smoker  . Smokeless tobacco: Never Used  Substance and Sexual Activity  . Alcohol use: No  . Drug use: No  . Sexual activity: Yes    Birth control/protection: None  Lifestyle  . Physical activity:    Days per week: Not on file      Minutes per session: Not on file  . Stress: Not on file  Relationships  . Social connections:    Talks on phone: Not on file    Gets together: Not on file    Attends religious service: Not on file    Active member of club or organization: Not on file    Attends meetings of clubs or organizations: Not on file    Relationship status: Not on file  . Intimate partner violence:    Fear of current or ex partner: Not on file    Emotionally abused: Not on file    Physically abused: Not on file    Forced sexual activity: Not on file  Other Topics Concern  . Not on file  Social History Narrative   Married.   2 children.   Graduated as a Oncologistsurgical technician.    [place]    Enjoys exercising, spending time with swimming.     Past Surgical History:  Procedure Laterality Date  . TONSILLECTOMY    . WISDOM TOOTH EXTRACTION      Family History  Problem Relation Age of Onset  . Asthma Mother   . Diabetes Mother   . Hyperlipidemia Father   . Heart failure Father   . Hypertension Father   . Diabetes Father   .  Stroke Paternal Grandfather   . Bladder Cancer Paternal Grandmother     Allergies  Allergen Reactions  . Amoxicillin Hives and Other (See Comments)    Reaction:  GI upset  Has patient had a PCN reaction causing immediate rash, facial/tongue/throat swelling, SOB or lightheadedness with hypotension: No Has patient had a PCN reaction causing severe rash involving mucus membranes or skin necrosis: No Has patient had a PCN reaction that required hospitalization No Has patient had a PCN reaction occurring within the last 10 years: Yes If all of the above answers are "NO", then may proceed with Cephalosporin use.    Current Outpatient Medications on File Prior to Visit  Medication Sig Dispense Refill  . Cholecalciferol (VITAMIN D3) 5000 units CAPS Take 1 capsule by mouth daily.     No current facility-administered medications on file prior to visit.     BP  110/74 (BP Location: Left Arm, Patient Position: Sitting, Cuff Size: Large)   Pulse 98   Temp 98.5 F (36.9 C) (Oral)   Ht 5' 1.5" (1.562 m)   Wt 230 lb (104.3 kg)   LMP 07/02/2017   SpO2 98%   BMI 42.75 kg/m    Objective:   Physical Exam  Constitutional: She appears well-nourished.  Cardiovascular: Normal rate and regular rhythm.  Pulmonary/Chest: Effort normal and breath sounds normal.  Abdominal: Soft. Bowel sounds are normal. There is no tenderness.  Periumbilical bulge representative of hernia hernia noted on exam.  Skin: Skin is warm and dry.  Psychiatric: She has a normal mood and affect.          Assessment & Plan:

## 2017-07-09 NOTE — Assessment & Plan Note (Signed)
Obvious on exam, present since 2014. Given increased symptoms with pain upon reduction, will send to general surgery for further evaluation. No sign of strangulation. Referral placed.

## 2017-07-27 ENCOUNTER — Encounter: Payer: Self-pay | Admitting: *Deleted

## 2017-07-29 ENCOUNTER — Encounter: Payer: Self-pay | Admitting: General Surgery

## 2017-07-29 ENCOUNTER — Ambulatory Visit: Payer: BLUE CROSS/BLUE SHIELD | Admitting: General Surgery

## 2017-07-29 VITALS — BP 138/88 | HR 98 | Resp 16 | Ht 61.0 in | Wt 234.0 lb

## 2017-07-29 DIAGNOSIS — R110 Nausea: Secondary | ICD-10-CM

## 2017-07-29 DIAGNOSIS — K429 Umbilical hernia without obstruction or gangrene: Secondary | ICD-10-CM

## 2017-07-29 NOTE — Patient Instructions (Addendum)
Umbilical Hernia, Adult A hernia is a bulge of tissue that pushes through an opening between muscles. An umbilical hernia happens in the abdomen, near the belly button (umbilicus). The hernia may contain tissues from the small intestine, large intestine, or fatty tissue covering the intestines (omentum). Umbilical hernias in adults tend to get worse over time, and they require surgical treatment. There are several types of umbilical hernias. You may have:  A hernia located just above or below the umbilicus (indirect hernia). This is the most common type of umbilical hernia in adults.  A hernia that forms through an opening formed by the umbilicus (direct hernia).  A hernia that comes and goes (reducible hernia). A reducible hernia may be visible only when you strain, lift something heavy, or cough. This type of hernia can be pushed back into the abdomen (reduced).  A hernia that traps abdominal tissue inside the hernia (incarcerated hernia). This type of hernia cannot be reduced.  A hernia that cuts off blood flow to the tissues inside the hernia (strangulated hernia). The tissues can start to die if this happens. This type of hernia requires emergency treatment.  What are the causes? An umbilical hernia happens when tissue inside the abdomen presses on a weak area of the abdominal muscles. What increases the risk? You may have a greater risk of this condition if you:  Are obese.  Have had several pregnancies.  Have a buildup of fluid inside your abdomen (ascites).  Have had surgery that weakens the abdominal muscles.  What are the signs or symptoms? The main symptom of this condition is a painless bulge at or near the belly button. A reducible hernia may be visible only when you strain, lift something heavy, or cough. Other symptoms may include:  Dull pain.  A feeling of pressure.  Symptoms of a strangulated hernia may include:  Pain that gets increasingly worse.  Nausea and  vomiting.  Pain when pressing on the hernia.  Skin over the hernia becoming red or purple.  Constipation.  Blood in the stool.  How is this diagnosed? This condition may be diagnosed based on:  A physical exam. You may be asked to cough or strain while standing. These actions increase the pressure inside your abdomen and force the hernia through the opening in your muscles. Your health care provider may try to reduce the hernia by pressing on it.  Your symptoms and medical history.  How is this treated? Surgery is the only treatment for an umbilical hernia. Surgery for a strangulated hernia is done as soon as possible. If you have a small hernia that is not incarcerated, you may need to lose weight before having surgery. Follow these instructions at home:  Lose weight, if told by your health care provider.  Do not try to push the hernia back in.  Watch your hernia for any changes in color or size. Tell your health care provider if any changes occur.  You may need to avoid activities that increase pressure on your hernia.  Do not lift anything that is heavier than 10 lb (4.5 kg) until your health care provider says that this is safe.  Take over-the-counter and prescription medicines only as told by your health care provider.  Keep all follow-up visits as told by your health care provider. This is important. Contact a health care provider if:  Your hernia gets larger.  Your hernia becomes painful. Get help right away if:  You develop sudden, severe pain near the   area of your hernia.  You have pain as well as nausea or vomiting.  You have pain and the skin over your hernia changes color.  You develop a fever. This information is not intended to replace advice given to you by your health care provider. Make sure you discuss any questions you have with your health care provider. Document Released: 09/06/2015 Document Revised: 12/08/2015 Document Reviewed:  09/06/2015 Elsevier Interactive Patient Education  2018 Elsevier Inc.  

## 2017-07-29 NOTE — Progress Notes (Signed)
Patient ID: Marie Flores, female   DOB: 06/24/1985, 32 y.o.   MRN: [id number]  Chief Complaint  Patient presents with  . Other    HPI Marie Flores is a 32 y.o. female here today for a evaluation of a umbilical hernia. Patient noticed this area about five years ago. In the last three months she has been nausea after meals and the area is not reducible.  The pain is consist. Had a abdomen ultrasound in 2017.   Past Medical History:  Diagnosis Date  . Gestational diabetes    diet controlled  . Headache   . Hx of varicella   . Scoliosis     Past Surgical History:  Procedure Laterality Date  . TONSILLECTOMY    . WISDOM TOOTH EXTRACTION      Family History  Problem Relation Age of Onset  . Asthma Mother   . Diabetes Mother   . Hyperlipidemia Father   . Heart failure Father   . Hypertension Father   . Diabetes Father   . Stroke Paternal Grandfather   . Bladder Cancer Paternal Grandmother     Social History Social History   Tobacco Use  . Smoking status: Never Smoker  . Smokeless tobacco: Never Used  Substance Use Topics  . Alcohol use: No  . Drug use: No    Allergies  Allergen Reactions  . Amoxicillin Hives and Other (See Comments)    Reaction:  GI upset  Has patient had a PCN reaction causing immediate rash, facial/tongue/throat swelling, SOB or lightheadedness with hypotension: No Has patient had a PCN reaction causing severe rash involving mucus membranes or skin necrosis: No Has patient had a PCN reaction that required hospitalization No Has patient had a PCN reaction occurring within the last 10 years: Yes If all of the above answers are "NO", then may proceed with Cephalosporin use.    Current Outpatient Medications  Medication Sig Dispense Refill  . Cholecalciferol (VITAMIN D3) 5000 units CAPS Take 5,000 Units by mouth at bedtime.     . Ascorbic Acid (VITAMIN C) 1000 MG tablet Take 1,000 mg by mouth at bedtime.    . fexofenadine  (ALLEGRA) 180 MG tablet Take 180 mg by mouth daily as needed for allergies.    Marland Kitchen. HOMEOPATHIC PRODUCTS OP Place 1-2 drops into both eyes 3 (three) times daily as needed (for allergy eye/irritation). Similasan Allergy Eye Relief Eye Drops    . ibuprofen (ADVIL,MOTRIN) 200 MG tablet Take 400-800 mg by mouth every 6 (six) hours as needed (pain/headache/migraine).    . Multiple Vitamins-Minerals (ADULT GUMMY PO) Take 2 tablets by mouth daily with lunch. VitaFusion Gummy Vitamin     No current facility-administered medications for this visit.     Review of Systems Review of Systems  Constitutional: Negative.   Respiratory: Negative.   Cardiovascular: Negative.     Blood pressure 138/88, pulse 98, resp. rate 16, height 5\' 1"  (1.549 m), weight 234 lb (106.1 kg), last menstrual period 07/02/2017, unknown if currently breastfeeding.  Physical Exam Physical Exam  Constitutional: She is oriented to person, place, and time. She appears well-developed and well-nourished.  Neck: Neck supple.  Cardiovascular: Normal rate, regular rhythm and normal heart sounds.  Pulmonary/Chest: Effort normal and breath sounds normal.  Abdominal: Soft. Normal appearance and bowel sounds are normal. There is tenderness in the periumbilical area. Rebound: 2cm defect umbilical hernia  A hernia is present.    Lymphadenopathy:    She has no cervical adenopathy.  Neurological:  She is alert and oriented to person, place, and time.  Skin: Skin is warm and dry.    Data Reviewed June 24, 2015 ultrasound as part of her right upper quadrant pain was negative for biliary pathology.  PCP notes of July 09, 2016 reviewed.  Assessment    Umbilical hernia, symptomatic.  Nausea and vomiting, possibly related to the hernia.    Plan I suggested that the patient have a repeat ultrasound to determine if she had developed gallstones, not atypical based on her age, gender and parity.  The idea being that if indeed she did have  gallstones this could be contributing to her nausea and both situation could be taken care of at the same time.  She is not interested in biliary assessment at this time.  The role for prosthetic mesh if the hernia defect is significantly larger than clinically evident today was discussed.  She is amenable to mesh placement if it meets criteria.       HPI, Physical Exam, Assessment and Plan have been scribed under the direction and in the presence of Donnalee Curry, MD.  Ples Specter, CMA I have completed the exam and reviewed the above documentation for accuracy and completeness.  I agree with the above.  Museum/gallery conservator has been used and any errors in dictation or transcription are unintentional.  Donnalee Curry, M.D., F.A.C.S.  [name] 07/30/2017, 7:20 AM  Patient's surgery has been scheduled for 08-11-17 at [facility].   Nicholes Mango, CMA

## 2017-07-30 ENCOUNTER — Telehealth: Payer: Self-pay

## 2017-07-30 DIAGNOSIS — R110 Nausea: Secondary | ICD-10-CM | POA: Insufficient documentation

## 2017-07-30 NOTE — Telephone Encounter (Signed)
Patient called and would like to reschedule surgery to a later date when her kids are out of school. She is resched[facility]. She will pre admit by phone. She will be seen for a pre op visit in the office with Dr Lemar LivingsByrnett on 09/07/17. The patient is aware of dates, time, and instructions.

## 2017-08-04 ENCOUNTER — Inpatient Hospital Stay: Admission: RE | Admit: 2017-08-04 | Payer: BLUE CROSS/BLUE SHIELD | Source: Ambulatory Visit

## 2017-08-13 ENCOUNTER — Encounter: Payer: Self-pay | Admitting: Certified Nurse Midwife

## 2017-08-13 ENCOUNTER — Ambulatory Visit: Payer: BLUE CROSS/BLUE SHIELD | Admitting: Certified Nurse Midwife

## 2017-08-13 VITALS — BP 120/80 | HR 90 | Ht 61.5 in | Wt 236.0 lb

## 2017-08-13 DIAGNOSIS — E282 Polycystic ovarian syndrome: Secondary | ICD-10-CM

## 2017-08-13 DIAGNOSIS — Z6841 Body Mass Index (BMI) 40.0 and over, adult: Secondary | ICD-10-CM

## 2017-08-13 DIAGNOSIS — N926 Irregular menstruation, unspecified: Secondary | ICD-10-CM

## 2017-08-13 LAB — POCT URINE PREGNANCY: PREG TEST UR: NEGATIVE

## 2017-08-13 NOTE — Progress Notes (Addendum)
Obstetrics & Gynecology Office Visit   Chief Complaint:  Chief Complaint  Patient presents with  . Menstrual Problem    late menses    History of Present Illness: 32 year old G2 P2002 presents today with complaints of being late on her menses but with a negative urine pregnancy test. Her LMP was 07/02/2017 and her menses are usually every 28-32 days apart. " The only time" she has been late or missed a period was when she was pregnant. With one of her children the UPT was negative but the beta was positive. She desires a blood pregnancy test. Has been having urinary frequency and galactorrhea in the last week or so. Current form of contraception is NFP (rhythm) and uses condoms during fertile periods. Past medical history is significant for obesity with BMI 43-44 today, polycystic ovarian syndrome, gestational diabetes with G2-controlled on glyburide   Review of Systems:  Review of Systems  Constitutional: Positive for malaise/fatigue. Negative for chills, fever and weight loss.       Change in appetite  HENT: Negative for congestion, sinus pain and sore throat.   Eyes: Negative for blurred vision and pain.  Respiratory: Negative for hemoptysis, shortness of breath and wheezing.   Cardiovascular: Negative for chest pain, palpitations and leg swelling.  Gastrointestinal: Negative for abdominal pain, blood in stool, diarrhea, heartburn, nausea and vomiting.  Genitourinary: Positive for frequency. Negative for dysuria, hematuria and urgency.  Musculoskeletal: Positive for joint pain. Negative for back pain and myalgias.  Skin: Negative for itching and rash.  Neurological: Negative for dizziness, tingling and headaches.  Endo/Heme/Allergies: Positive for environmental allergies (sneezing and coughing). Negative for polydipsia. Does not bruise/bleed easily.          Psychiatric/Behavioral: Negative for depression. The patient is not nervous/anxious and does not have insomnia.    Breast:  positive for nipple discharge and breast tenderness  Past Medical History:  Past Medical History:  Diagnosis Date  . Gestational diabetes    on glyburide  . Headache   . Hx of varicella   . Morbid obesity with BMI of 40.0-44.9, adult (HCC)   . Polycystic ovarian syndrome   . Scoliosis     Past Surgical History:  Past Surgical History:  Procedure Laterality Date  . TONSILLECTOMY    . WISDOM TOOTH EXTRACTION      Gynecologic History: Patient's last menstrual period was 07/02/2017 (exact date).  Obstetric History: [id number]  Family History:  Family History  Problem Relation Age of Onset  . Asthma Mother   . Diabetes Mother   . Hyperlipidemia Father   . Heart failure Father   . Hypertension Father   . Diabetes Father   . Stroke Paternal Grandfather   . Bladder Cancer Paternal Grandmother     Social History:  Social History   Socioeconomic History  . Marital status: Married    Spouse name: Not on file  . Number of children: 2  . Years of education: Not on file  . Highest education level: Not on file  Occupational History  . Not on file  Social Needs  . Financial resource strain: Not on file  . Food insecurity:    Worry: Not on file    Inability: Not on file  . Transportation needs:    Medical: Not on file    Non-medical: Not on file  Tobacco Use  . Smoking status: Never Smoker  . Smokeless tobacco: Never Used  Substance and Sexual Activity  . Alcohol  use: No  . Drug use: No  . Sexual activity: Yes    Birth control/protection: None  Lifestyle  . Physical activity:    Days per week: 1 day    Minutes per session: 60 min  . Stress: Only a little  Relationships  . Social connections:    Talks on phone: Not on file    Gets together: Not on file    Attends religious service: Not on file    Active member of club or organization: Not on file    Attends meetings of clubs or organizations: Not on file    Relationship status: Not on file  . Intimate partner  violence:    Fear of current or ex partner: Not on file    Emotionally abused: Not on file    Physically abused: Not on file    Forced sexual activity: Not on file  Other Topics Concern  . Not on file  Social History Narrative   Married.   2 children.   Graduated as a Oncologist.    Moved from [place].    Enjoys exercising, spending time with swimming.     Allergies:  Allergies  Allergen Reactions  . Amoxicillin Hives and Other (See Comments)    Reaction:  GI upset  Has patient had a PCN reaction causing immediate rash, facial/tongue/throat swelling, SOB or lightheadedness with hypotension: No Has patient had a PCN reaction causing severe rash involving mucus membranes or skin necrosis: No Has patient had a PCN reaction that required hospitalization No Has patient had a PCN reaction occurring within the last 10 years: Yes If all of the above answers are "NO", then may proceed with Cephalosporin use.    Medications: Prior to Admission medications   Medication Sig Start Date End Date Taking? Authorizing Provider  Ascorbic Acid (VITAMIN C) 1000 MG tablet Take 1,000 mg by mouth at bedtime.   Yes [provider]  Cholecalciferol (VITAMIN D3) 5000 units CAPS Take 5,000 Units by mouth at bedtime.    Yes [provider]  fexofenadine (ALLEGRA) 180 MG tablet Take 180 mg by mouth daily as needed for allergies.   Yes [provider]  HOMEOPATHIC PRODUCTS OP Place 1-2 drops into both eyes 3 (three) times daily as needed (for allergy eye/irritation). Similasan Allergy Eye Relief Eye Drops   Yes [provider]  ibuprofen (ADVIL,MOTRIN) 200 MG tablet Take 400-800 mg by mouth every 6 (six) hours as needed (pain/headache/migraine).   Yes [provider]  Multiple Vitamins-Minerals (ADULT GUMMY PO) Take 2 tablets by mouth daily with lunch. VitaFusion Gummy Vitamin   Yes [provider]    Physical Exam  Vital signs: BP 120/80    Pulse 90   Ht 5' 1.5" (1.562 m)   Wt 236 lb (107 kg)   LMP 07/02/2017 (Exact Date)   BMI 43.87 kg/m   Physical Exam  Constitutional: She is oriented to person, place, and time. She appears well-developed and well-nourished. No distress.  Cardiovascular: Normal rate.  Respiratory: Effort normal.  Neurological: She is alert and oriented to person, place, and time.  Psychiatric: She has a normal mood and affect. Her behavior is normal.     Assessment: 32 y.o. [id number] with late menses and some pregnancy symptoms  Plan: Beta HCG ordered If positive, start prenatal care If negative and menses does not start, RTO for further workup.  Farrel Conners, CNM

## 2017-08-14 ENCOUNTER — Encounter (INDEPENDENT_AMBULATORY_CARE_PROVIDER_SITE_OTHER): Payer: Self-pay

## 2017-08-14 ENCOUNTER — Encounter: Payer: Self-pay | Admitting: Certified Nurse Midwife

## 2017-08-14 DIAGNOSIS — E282 Polycystic ovarian syndrome: Secondary | ICD-10-CM | POA: Insufficient documentation

## 2017-08-14 LAB — BETA HCG QUANT (REF LAB): hCG Quant: <1 m[IU]/mL

## 2017-08-20 ENCOUNTER — Telehealth: Payer: Self-pay | Admitting: Certified Nurse Midwife

## 2017-08-20 NOTE — Telephone Encounter (Signed)
Patient is calling to report negative pregnancy test. Per patient CLG want her to follow up for the lab for her Thyroid. Please advise . Order Lab. I will contact patient .

## 2017-08-23 ENCOUNTER — Other Ambulatory Visit: Payer: Self-pay | Admitting: Certified Nurse Midwife

## 2017-08-23 DIAGNOSIS — N643 Galactorrhea not associated with childbirth: Secondary | ICD-10-CM

## 2017-08-23 DIAGNOSIS — N926 Irregular menstruation, unspecified: Secondary | ICD-10-CM

## 2017-08-23 NOTE — Telephone Encounter (Signed)
Marie Flores, the orders are in. She can have these labs drawn.

## 2017-08-24 NOTE — Telephone Encounter (Signed)
Called and left voice mail for patient to call back to be schedule °

## 2017-08-25 ENCOUNTER — Other Ambulatory Visit: Payer: BLUE CROSS/BLUE SHIELD

## 2017-08-25 DIAGNOSIS — N926 Irregular menstruation, unspecified: Secondary | ICD-10-CM

## 2017-08-25 DIAGNOSIS — N643 Galactorrhea not associated with childbirth: Secondary | ICD-10-CM

## 2017-08-26 LAB — TSH: TSH: 2.33 u[IU]/mL (ref 0.450–4.500)

## 2017-08-26 LAB — PROLACTIN: Prolactin: 10.2 ng/mL (ref 4.8–23.3)

## 2017-09-07 ENCOUNTER — Encounter: Payer: Self-pay | Admitting: General Surgery

## 2017-09-07 ENCOUNTER — Ambulatory Visit: Payer: BLUE CROSS/BLUE SHIELD | Admitting: General Surgery

## 2017-09-07 VITALS — BP 108/66 | HR 91 | Resp 20 | Ht 61.5 in | Wt 238.0 lb

## 2017-09-07 DIAGNOSIS — K429 Umbilical hernia without obstruction or gangrene: Secondary | ICD-10-CM

## 2017-09-07 DIAGNOSIS — K219 Gastro-esophageal reflux disease without esophagitis: Secondary | ICD-10-CM

## 2017-09-07 NOTE — Progress Notes (Signed)
Patient ID: Marie Flores, female   DOB: [birth year], 32 y.o.   MRN: [id number]  Chief Complaint  Patient presents with  . Pre-op Exam    HPI Marie Flores is a 32 y.o. female here today for her pre op umbilical hernia scheduled scheduled on 09/20/2017.  She is here with her husband, Marie Flores and 2 children. HPI  Past Medical History:  Diagnosis Date  . Gestational diabetes    on glyburide  . Headache   . Hx of varicella   . Morbid obesity with BMI of 40.0-44.9, adult (HCC)   . Polycystic ovarian syndrome   . Scoliosis     Past Surgical History:  Procedure Laterality Date  . TONSILLECTOMY    . WISDOM TOOTH EXTRACTION      Family History  Problem Relation Age of Onset  . Asthma Mother   . Diabetes Mother   . Hyperlipidemia Father   . Heart failure Father   . Hypertension Father   . Diabetes Father   . Stroke Paternal Grandfather   . Bladder Cancer Paternal Grandmother     Social History Social History   Tobacco Use  . Smoking status: Never Smoker  . Smokeless tobacco: Never Used  Substance Use Topics  . Alcohol use: No  . Drug use: No    Allergies  Allergen Reactions  . Amoxicillin Hives and Other (See Comments)    Reaction:  GI upset  Has patient had a PCN reaction causing immediate rash, facial/tongue/throat swelling, SOB or lightheadedness with hypotension: No Has patient had a PCN reaction causing severe rash involving mucus membranes or skin necrosis: No Has patient had a PCN reaction that required hospitalization No Has patient had a PCN reaction occurring within the last 10 years: Yes If all of the above answers are "NO", then may proceed with Cephalosporin use.    Current Outpatient Medications  Medication Sig Dispense Refill  . Ascorbic Acid (VITAMIN C) 1000 MG tablet Take 1,000 mg by mouth at bedtime.    . Cholecalciferol (VITAMIN D3) 5000 units CAPS Take 5,000 Units by mouth at bedtime.     . fexofenadine (ALLEGRA) 180 MG  tablet Take 180 mg by mouth daily as needed for allergies.    Marland Kitchen HOMEOPATHIC PRODUCTS OP Place 1-2 drops into both eyes 3 (three) times daily as needed (for allergy eye/irritation). Similasan Allergy Eye Relief Eye Drops    . ibuprofen (ADVIL,MOTRIN) 200 MG tablet Take 400-800 mg by mouth every 6 (six) hours as needed (pain/headache/migraine).    . Multiple Vitamins-Minerals (ADULT GUMMY PO) Take 2 tablets by mouth daily with lunch. VitaFusion Gummy Vitamin     No current facility-administered medications for this visit.     Review of Systems Review of Systems  Constitutional: Negative.   Respiratory: Negative.   Cardiovascular: Negative.   Gastrointestinal:       Patient reports a long history of acid reflux dating back to high school.  Worse with change in position and high carbohydrate meals.    Blood pressure 108/66, pulse 91, resp. rate 20, height 5' 1.5" (1.562 m), weight 238 lb (108 kg), last menstrual period 08/24/2017, SpO2 97 %, unknown if currently breastfeeding.  Physical Exam Physical Exam  Constitutional: She is oriented to person, place, and time. She appears well-developed and well-nourished.  Eyes: Conjunctivae are normal. No scleral icterus.  Neck: Neck supple.  Cardiovascular: Normal rate, regular rhythm and normal heart sounds.  Pulmonary/Chest: Effort normal and breath sounds normal.  Abdominal: A  hernia (umbilical hernia) is present.    Lymphadenopathy:    She has no cervical adenopathy.  Neurological: She is alert and oriented to person, place, and time.  Skin: Skin is warm and dry.    Data Reviewed No new data.  Assessment     Symptomatic umbilical hernia.  Likely esophageal reflux based on weight and diet.        Plan  Hernia precautions and incarceration were discussed with the patient. If they develop symptoms of an incarcerated hernia, they were encouraged to seek prompt medical attention.  I have recommended repair of the hernia possibly  this will be placed using mesh on an outpatient basis in the near future. The risk of infection was reviewed. The role of prosthetic mesh to minimize the risk of recurrence was reviewed.  The use of mesh will be based on the fascial defect size noted at the time of surgery.  Dietary modification to minimize reflux symptoms reviewed.  Umbilical hernia repair scheduled on 09/20/2017.   HPI, Physical Exam, Assessment and Plan have been scribed under the direction and in the presence of Donnalee Curry, MD.  Marie Flores, CMA  Marie Flores 09/07/2017, 7:11 PM

## 2017-09-07 NOTE — Patient Instructions (Signed)
Umbilical Hernia, Adult A hernia is a bulge of tissue that pushes through an opening between muscles. An umbilical hernia happens in the abdomen, near the belly button (umbilicus). The hernia may contain tissues from the small intestine, large intestine, or fatty tissue covering the intestines (omentum). Umbilical hernias in adults tend to get worse over time, and they require surgical treatment. There are several types of umbilical hernias. You may have:  A hernia located just above or below the umbilicus (indirect hernia). This is the most common type of umbilical hernia in adults.  A hernia that forms through an opening formed by the umbilicus (direct hernia).  A hernia that comes and goes (reducible hernia). A reducible hernia may be visible only when you strain, lift something heavy, or cough. This type of hernia can be pushed back into the abdomen (reduced).  A hernia that traps abdominal tissue inside the hernia (incarcerated hernia). This type of hernia cannot be reduced.  A hernia that cuts off blood flow to the tissues inside the hernia (strangulated hernia). The tissues can start to die if this happens. This type of hernia requires emergency treatment.  What are the causes? An umbilical hernia happens when tissue inside the abdomen presses on a weak area of the abdominal muscles. What increases the risk? You may have a greater risk of this condition if you:  Are obese.  Have had several pregnancies.  Have a buildup of fluid inside your abdomen (ascites).  Have had surgery that weakens the abdominal muscles.  What are the signs or symptoms? The main symptom of this condition is a painless bulge at or near the belly button. A reducible hernia may be visible only when you strain, lift something heavy, or cough. Other symptoms may include:  Dull pain.  A feeling of pressure.  Symptoms of a strangulated hernia may include:  Pain that gets increasingly worse.  Nausea and  vomiting.  Pain when pressing on the hernia.  Skin over the hernia becoming red or purple.  Constipation.  Blood in the stool.  How is this diagnosed? This condition may be diagnosed based on:  A physical exam. You may be asked to cough or strain while standing. These actions increase the pressure inside your abdomen and force the hernia through the opening in your muscles. Your health care provider may try to reduce the hernia by pressing on it.  Your symptoms and medical history.  How is this treated? Surgery is the only treatment for an umbilical hernia. Surgery for a strangulated hernia is done as soon as possible. If you have a small hernia that is not incarcerated, you may need to lose weight before having surgery. Follow these instructions at home:  Lose weight, if told by your health care provider.  Do not try to push the hernia back in.  Watch your hernia for any changes in color or size. Tell your health care provider if any changes occur.  You may need to avoid activities that increase pressure on your hernia.  Do not lift anything that is heavier than 10 lb (4.5 kg) until your health care provider says that this is safe.  Take over-the-counter and prescription medicines only as told by your health care provider.  Keep all follow-up visits as told by your health care provider. This is important. Contact a health care provider if:  Your hernia gets larger.  Your hernia becomes painful. Get help right away if:  You develop sudden, severe pain near the   area of your hernia.  You have pain as well as nausea or vomiting.  You have pain and the skin over your hernia changes color.  You develop a fever. This information is not intended to replace advice given to you by your health care provider. Make sure you discuss any questions you have with your health care provider. Document Released: 09/06/2015 Document Revised: 12/08/2015 Document Reviewed:  09/06/2015 Elsevier Interactive Patient Education  2018 [employer]  

## 2017-09-10 ENCOUNTER — Encounter (INDEPENDENT_AMBULATORY_CARE_PROVIDER_SITE_OTHER): Payer: Self-pay

## 2017-09-14 ENCOUNTER — Encounter
Admission: RE | Admit: 2017-09-14 | Discharge: 2017-09-14 | Disposition: A | Discharge status: Home / Self Care | Payer: BLUE CROSS/BLUE SHIELD | Source: Ambulatory Visit | Attending: General Surgery | Admitting: General Surgery

## 2017-09-14 ENCOUNTER — Other Ambulatory Visit: Payer: Self-pay

## 2017-09-14 HISTORY — DX: Family history of other specified conditions: Z84.89

## 2017-09-14 HISTORY — DX: Anemia, unspecified: D64.9

## 2017-09-14 HISTORY — DX: Personal history of urinary calculi: Z87.442

## 2017-09-14 HISTORY — DX: Gastro-esophageal reflux disease without esophagitis: K21.9

## 2017-09-14 NOTE — Patient Instructions (Signed)
Your procedure is scheduled on: 09-20-17 MONDAY Report to Same Day Surgery 2nd floor medical mall [facility] Entrance-take elevator on left to 2nd floor.  Check in with surgery information desk.) To find out your arrival time please call [phone] between 1PM - 3PM on 09-17-17 FRIDAY  Remember: Instructions that are not followed completely may result in serious medical risk, up to and including death, or upon the discretion of your surgeon and anesthesiologist your surgery may need to be rescheduled.    _x___ 1. Do not eat food after midnight the night before your procedure. NO GUM OR CANDY AFTER MIDNIGHT.  You may drink clear liquids up to 2 hours before you are scheduled to arrive at the hospital for your procedure.  Do not drink clear liquids within 2 hours of your scheduled arrival to the hospital.  Clear liquids include  --Water or Apple juice without pulp  --Clear carbohydrate beverage such as ClearFast or Gatorade  --Black Coffee or Clear Tea (No milk, no creamers, do not add anything to the coffee or Tea    __x__ 2. No Alcohol for 24 hours before or after surgery.   __x__3. No Smoking or e-cigarettes for 24 prior to surgery.  Do not use any chewable tobacco products for at least 6 hour prior to surgery   ____  4. Bring all medications with you on the day of surgery if instructed.    __x__ 5. Notify your doctor if there is any change in your medical condition     (cold, fever, infections).    x___6. On the morning of surgery brush your teeth with toothpaste and water.  You may rinse your mouth with mouth wash if you wish.  Do not swallow any toothpaste or mouthwash.   Do not wear jewelry, make-up, hairpins, clips or nail polish.  Do not wear lotions, powders, or perfumes. You may wear deodorant.  Do not shave 48 hours prior to surgery. Men may shave face and neck.  Do not bring valuables to the hospital.    [facility] is not responsible for any belongings or  valuables.               Contacts, dentures or bridgework may not be worn into surgery.  Leave your suitcase in the car. After surgery it may be brought to your room.  For patients admitted to the hospital, discharge time is determined by your treatment team.  _  Patients discharged the day of surgery will not be allowed to drive home.  You will need someone to drive you home and stay with you the night of your procedure.    Please read over the following fact sheets that you were given:   [facility] Preparing for Surgery and or MRSA Information   ____ Take anti-hypertensive listed below, cardiac, seizure, asthma, anti-reflux and psychiatric medicines. These include:  1. NONE  2.  3.  4.  5.  6.  ____Fleets enema or Magnesium Citrate as directed.   _x___ Use CHG Soap or sage wipes as directed on instruction sheet   ____ Use inhalers on the day of surgery and bring to hospital day of surgery  ____ Stop Metformin and Janumet 2 days prior to surgery.    ____ Take 1/2 of usual insulin dose the night before surgery and none on the morning surgery.   ____ Follow recommendations from Cardiologist, Pulmonologist or PCP regarding stopping Aspirin, Coumadin, Plavix ,Eliquis, Effient, or Pradaxa, and Pletal.  ____Stop Anti-inflammatories  such as Advil, Aleve, Ibuprofen, Motrin, Naproxen, Naprosyn, Goodies powders or aspirin products. OK to take Tylenol    ____ Stop supplements until after surgery.     ____ Bring C-Pap to the hospital.

## 2017-09-16 ENCOUNTER — Encounter
Admission: RE | Admit: 2017-09-16 | Discharge: 2017-09-16 | Disposition: A | Discharge status: Home / Self Care | Payer: BLUE CROSS/BLUE SHIELD | Source: Ambulatory Visit | Attending: General Surgery | Admitting: General Surgery

## 2017-09-16 DIAGNOSIS — Z01812 Encounter for preprocedural laboratory examination: Secondary | ICD-10-CM | POA: Insufficient documentation

## 2017-09-16 LAB — BASIC METABOLIC PANEL
ANION GAP: 10 (ref 5–15)
BUN: 11 mg/dL (ref 6–20)
CHLORIDE: 105 mmol/L (ref 101–111)
CO2: 22 mmol/L (ref 22–32)
CREATININE: 0.6 mg/dL (ref 0.44–1.00)
Calcium: 8.8 mg/dL — ABNORMAL LOW (ref 8.9–10.3)
GFR calc non Af Amer: >60 mL/min (ref >60)
Glucose, Bld: 124 mg/dL — ABNORMAL HIGH (ref 65–99)
POTASSIUM: 3.8 mmol/L (ref 3.5–5.1)
SODIUM: 137 mmol/L (ref 135–145)

## 2017-09-16 LAB — HEMOGLOBIN: Hemoglobin: 14.5 g/dL (ref 12.0–16.0)

## 2017-09-18 DIAGNOSIS — K429 Umbilical hernia without obstruction or gangrene: Secondary | ICD-10-CM

## 2017-09-18 HISTORY — DX: Umbilical hernia without obstruction or gangrene: K42.9

## 2017-09-18 NOTE — Anesthesia Preprocedure Evaluation (Addendum)
Anesthesia Evaluation  Patient identified by MRN, date of birth, ID band Patient awake    Reviewed: Allergy & Precautions, H&P , NPO status , reviewed documented beta blocker date and time   History of Anesthesia Complications (+) Family history of anesthesia reaction  Airway Mallampati: II  TM Distance: >3 FB Neck ROM: full    Dental  (+) Teeth Intact   Pulmonary    Pulmonary exam normal        Cardiovascular Normal cardiovascular exam     Neuro/Psych  Headaches,    GI/Hepatic GERD  Controlled,  Endo/Other  diabetesMorbid obesity  Renal/GU      Musculoskeletal   Abdominal   Peds  Hematology  (+) anemia ,   Anesthesia Other Findings Past Medical History: No date: Anemia     Comment:  as a teeenager No date: Family history of adverse reaction to anesthesia     Comment:  mom-hard to wake up No date: GERD (gastroesophageal reflux disease)     Comment:  tums prn No date: Gestational diabetes No date: Headache     Comment:  migraines-hormonal No date: History of kidney stones     Comment:  h/o No date: Hx of varicella No date: Morbid obesity with BMI of 40.0-44.9, adult (HCC) No date: Polycystic ovarian syndrome No date: Scoliosis  Past Surgical History: No date: TONSILLECTOMY No date: WISDOM TOOTH EXTRACTION     Reproductive/Obstetrics                            Anesthesia Physical Anesthesia Plan  ASA: III  Anesthesia Plan: General   Post-op Pain Management:    Induction:   PONV Risk Score and Plan: 3 and Treatment may vary due to age or medical condition, Ondansetron and Midazolam  Airway Management Planned:   Additional Equipment:   Intra-op Plan:   Post-operative Plan:   Informed Consent: I have reviewed the patients History and Physical, chart, labs and discussed the procedure including the risks, benefits and alternatives for the proposed anesthesia with  the patient or authorized representative who has indicated his/her understanding and acceptance.   Dental Advisory Given  Plan Discussed with: CRNA  Anesthesia Plan Comments:         Anesthesia Quick Evaluation

## 2017-09-19 MED ORDER — CEFAZOLIN SODIUM-DEXTROSE 2-4 GM/100ML-% IV SOLN
2.0000 g | INTRAVENOUS | Status: AC
  Administered 2017-09-20: 2 g via INTRAVENOUS

## 2017-09-20 ENCOUNTER — Encounter
Admission: RE | Disposition: A | Discharge status: Home / Self Care | Payer: Self-pay | Source: Ambulatory Visit | Attending: General Surgery

## 2017-09-20 ENCOUNTER — Ambulatory Visit
Admission: RE | Admit: 2017-09-20 | Discharge: 2017-09-20 | Disposition: A | Discharge status: Home / Self Care | Payer: BLUE CROSS/BLUE SHIELD | Source: Ambulatory Visit | Attending: General Surgery | Admitting: General Surgery

## 2017-09-20 ENCOUNTER — Encounter: Payer: Self-pay | Admitting: *Deleted

## 2017-09-20 ENCOUNTER — Ambulatory Visit: Payer: BLUE CROSS/BLUE SHIELD | Admitting: Anesthesiology

## 2017-09-20 ENCOUNTER — Other Ambulatory Visit: Payer: Self-pay

## 2017-09-20 DIAGNOSIS — Z880 Allergy status to penicillin: Secondary | ICD-10-CM | POA: Diagnosis not present

## 2017-09-20 DIAGNOSIS — K429 Umbilical hernia without obstruction or gangrene: Secondary | ICD-10-CM

## 2017-09-20 DIAGNOSIS — E119 Type 2 diabetes mellitus without complications: Secondary | ICD-10-CM | POA: Insufficient documentation

## 2017-09-20 DIAGNOSIS — K219 Gastro-esophageal reflux disease without esophagitis: Secondary | ICD-10-CM | POA: Diagnosis not present

## 2017-09-20 DIAGNOSIS — K420 Umbilical hernia with obstruction, without gangrene: Secondary | ICD-10-CM | POA: Diagnosis not present

## 2017-09-20 DIAGNOSIS — Z6841 Body Mass Index (BMI) 40.0 and over, adult: Secondary | ICD-10-CM | POA: Diagnosis not present

## 2017-09-20 HISTORY — DX: Umbilical hernia without obstruction or gangrene: K42.9

## 2017-09-20 HISTORY — PX: UMBILICAL HERNIA REPAIR: SHX196

## 2017-09-20 LAB — POCT PREGNANCY, URINE: Preg Test, Ur: NEGATIVE

## 2017-09-20 SURGERY — REPAIR, HERNIA, UMBILICAL, ADULT
Anesthesia: General | Site: Abdomen | Wound class: Clean Contaminated

## 2017-09-20 MED ORDER — FENTANYL CITRATE (PF) 100 MCG/2ML IJ SOLN
INTRAMUSCULAR | Status: DC | PRN
  Administered 2017-09-20 (×2): 50 ug via INTRAVENOUS

## 2017-09-20 MED ORDER — GLYCOPYRROLATE 0.2 MG/ML IJ SOLN
INTRAMUSCULAR | Status: DC | PRN
  Administered 2017-09-20: 0.2 mg via INTRAVENOUS

## 2017-09-20 MED ORDER — KETOROLAC TROMETHAMINE 30 MG/ML IJ SOLN
INTRAMUSCULAR | Status: AC
  Filled 2017-09-20: qty 1

## 2017-09-20 MED ORDER — DEXAMETHASONE SODIUM PHOSPHATE 10 MG/ML IJ SOLN
INTRAMUSCULAR | Status: DC | PRN
  Administered 2017-09-20: 10 mg via INTRAVENOUS

## 2017-09-20 MED ORDER — PROMETHAZINE HCL 25 MG/ML IJ SOLN: 6.2500 mg | INTRAMUSCULAR | Status: DC | PRN

## 2017-09-20 MED ORDER — MIDAZOLAM HCL 2 MG/2ML IJ SOLN
INTRAMUSCULAR | Status: DC | PRN
  Administered 2017-09-20: 2 mg via INTRAVENOUS

## 2017-09-20 MED ORDER — GABAPENTIN 300 MG PO CAPS
300.0000 mg | ORAL_CAPSULE | ORAL | Status: AC
  Administered 2017-09-20: 300 mg via ORAL

## 2017-09-20 MED ORDER — HYDROCODONE-ACETAMINOPHEN 5-325 MG PO TABS: 1.0000 | ORAL_TABLET | ORAL | 0 refills | Status: DC | PRN

## 2017-09-20 MED ORDER — MIDAZOLAM HCL 2 MG/2ML IJ SOLN
INTRAMUSCULAR | Status: AC
  Filled 2017-09-20: qty 2

## 2017-09-20 MED ORDER — FAMOTIDINE 20 MG PO TABS
20.0000 mg | ORAL_TABLET | Freq: Once | ORAL | Status: AC
  Administered 2017-09-20: 20 mg via ORAL

## 2017-09-20 MED ORDER — ACETAMINOPHEN 325 MG PO TABS: 325.0000 mg | ORAL_TABLET | ORAL | Status: DC | PRN

## 2017-09-20 MED ORDER — ACETAMINOPHEN 10 MG/ML IV SOLN
INTRAVENOUS | Status: DC | PRN
  Administered 2017-09-20: 1000 mg via INTRAVENOUS

## 2017-09-20 MED ORDER — PROPOFOL 10 MG/ML IV BOLUS
INTRAVENOUS | Status: DC | PRN
  Administered 2017-09-20: 170 mg via INTRAVENOUS
  Administered 2017-09-20: 30 mg via INTRAVENOUS

## 2017-09-20 MED ORDER — LIDOCAINE HCL (CARDIAC) PF 100 MG/5ML IV SOSY
PREFILLED_SYRINGE | INTRAVENOUS | Status: DC | PRN
  Administered 2017-09-20: 100 mg via INTRAVENOUS

## 2017-09-20 MED ORDER — HYDROCODONE-ACETAMINOPHEN 7.5-325 MG PO TABS
1.0000 | ORAL_TABLET | Freq: Once | ORAL | Status: AC | PRN
  Administered 2017-09-20: 1 via ORAL
  Filled 2017-09-20: qty 1

## 2017-09-20 MED ORDER — BUPIVACAINE HCL (PF) 0.5 % IJ SOLN
INTRAMUSCULAR | Status: AC
  Filled 2017-09-20: qty 30

## 2017-09-20 MED ORDER — BUPIVACAINE HCL 0.5 % IJ SOLN
INTRAMUSCULAR | Status: DC | PRN
  Administered 2017-09-20: 30 mL

## 2017-09-20 MED ORDER — ACETAMINOPHEN 10 MG/ML IV SOLN
INTRAVENOUS | Status: AC
  Filled 2017-09-20: qty 100

## 2017-09-20 MED ORDER — KETOROLAC TROMETHAMINE 30 MG/ML IJ SOLN
INTRAMUSCULAR | Status: DC | PRN
  Administered 2017-09-20: 30 mg via INTRAVENOUS

## 2017-09-20 MED ORDER — LIDOCAINE HCL (PF) 2 % IJ SOLN
INTRAMUSCULAR | Status: AC
  Filled 2017-09-20: qty 10

## 2017-09-20 MED ORDER — HYDROMORPHONE HCL 1 MG/ML IJ SOLN
0.2500 mg | INTRAMUSCULAR | Status: DC | PRN
  Administered 2017-09-20 (×4): 0.5 mg via INTRAVENOUS

## 2017-09-20 MED ORDER — GLYCOPYRROLATE 0.2 MG/ML IJ SOLN
INTRAMUSCULAR | Status: AC
  Filled 2017-09-20: qty 1

## 2017-09-20 MED ORDER — HYDROMORPHONE HCL 1 MG/ML IJ SOLN
INTRAMUSCULAR | Status: AC
  Administered 2017-09-20: 0.5 mg via INTRAVENOUS
  Filled 2017-09-20: qty 1

## 2017-09-20 MED ORDER — DEXAMETHASONE SODIUM PHOSPHATE 10 MG/ML IJ SOLN
INTRAMUSCULAR | Status: AC
  Filled 2017-09-20: qty 1

## 2017-09-20 MED ORDER — ONDANSETRON HCL 4 MG/2ML IJ SOLN
INTRAMUSCULAR | Status: AC
  Filled 2017-09-20: qty 2

## 2017-09-20 MED ORDER — CELECOXIB 200 MG PO CAPS
200.0000 mg | ORAL_CAPSULE | ORAL | Status: AC
  Administered 2017-09-20: 200 mg via ORAL

## 2017-09-20 MED ORDER — FAMOTIDINE 20 MG PO TABS
ORAL_TABLET | ORAL | Status: AC
  Administered 2017-09-20: 20 mg via ORAL
  Filled 2017-09-20: qty 1

## 2017-09-20 MED ORDER — GABAPENTIN 300 MG PO CAPS
ORAL_CAPSULE | ORAL | Status: AC
  Administered 2017-09-20: 300 mg via ORAL
  Filled 2017-09-20: qty 1

## 2017-09-20 MED ORDER — HYDROCODONE-ACETAMINOPHEN 7.5-325 MG PO TABS
ORAL_TABLET | ORAL | Status: AC
  Filled 2017-09-20: qty 1

## 2017-09-20 MED ORDER — EPHEDRINE SULFATE 50 MG/ML IJ SOLN
INTRAMUSCULAR | Status: AC
  Filled 2017-09-20: qty 1

## 2017-09-20 MED ORDER — FENTANYL CITRATE (PF) 100 MCG/2ML IJ SOLN
INTRAMUSCULAR | Status: AC
  Filled 2017-09-20: qty 2

## 2017-09-20 MED ORDER — LACTATED RINGERS IV SOLN
INTRAVENOUS | Status: DC
  Administered 2017-09-20: 50 mL/h via INTRAVENOUS

## 2017-09-20 MED ORDER — CELECOXIB 200 MG PO CAPS
ORAL_CAPSULE | ORAL | Status: AC
  Administered 2017-09-20: 200 mg via ORAL
  Filled 2017-09-20: qty 1

## 2017-09-20 MED ORDER — MEPERIDINE HCL 50 MG/ML IJ SOLN: 6.2500 mg | INTRAMUSCULAR | Status: DC | PRN

## 2017-09-20 MED ORDER — CEFAZOLIN SODIUM-DEXTROSE 2-4 GM/100ML-% IV SOLN
INTRAVENOUS | Status: AC
  Filled 2017-09-20: qty 100

## 2017-09-20 MED ORDER — PROPOFOL 10 MG/ML IV BOLUS
INTRAVENOUS | Status: AC
  Filled 2017-09-20: qty 20

## 2017-09-20 MED ORDER — PHENYLEPHRINE HCL 10 MG/ML IJ SOLN
INTRAMUSCULAR | Status: AC
  Filled 2017-09-20: qty 1

## 2017-09-20 MED ORDER — SUCCINYLCHOLINE CHLORIDE 20 MG/ML IJ SOLN
INTRAMUSCULAR | Status: AC
Start: 2017-09-20 — End: ?
  Filled 2017-09-20: qty 1

## 2017-09-20 MED ORDER — ACETAMINOPHEN 160 MG/5ML PO SOLN
325.0000 mg | ORAL | Status: DC | PRN
  Filled 2017-09-20: qty 20.3

## 2017-09-20 SURGICAL SUPPLY — 30 items
BLADE SURG 15 STRL SS SAFETY (BLADE) ×2 IMPLANT
CANISTER SUCT 1200ML W/VALVE (MISCELLANEOUS) ×2 IMPLANT
CHLORAPREP W/TINT 26ML (MISCELLANEOUS) ×2 IMPLANT
DRAPE LAPAROTOMY 100X77 ABD (DRAPES) ×2 IMPLANT
DRSG TEGADERM 4X4.75 (GAUZE/BANDAGES/DRESSINGS) ×2 IMPLANT
DRSG TELFA 4X3 1S NADH ST (GAUZE/BANDAGES/DRESSINGS) ×2 IMPLANT
ELECT REM PT RETURN 9FT ADLT (ELECTROSURGICAL) ×2
ELECTRODE REM PT RTRN 9FT ADLT (ELECTROSURGICAL) ×1 IMPLANT
GLOVE BIO SURGEON STRL SZ7.5 (GLOVE) ×2 IMPLANT
GLOVE INDICATOR 8.0 STRL GRN (GLOVE) ×2 IMPLANT
GOWN STRL REUS W/ TWL LRG LVL3 (GOWN DISPOSABLE) ×2 IMPLANT
GOWN STRL REUS W/TWL LRG LVL3 (GOWN DISPOSABLE) ×2
KIT TURNOVER KIT A (KITS) ×2 IMPLANT
LABEL OR SOLS (LABEL) ×2 IMPLANT
MESH VENTRALEX ST 8CM LRG (Mesh General) ×2 IMPLANT
NEEDLE HYPO 22GX1.5 SAFETY (NEEDLE) ×4 IMPLANT
NEEDLE HYPO 25X1 1.5 SAFETY (NEEDLE) ×2 IMPLANT
NS IRRIG 500ML POUR BTL (IV SOLUTION) ×2 IMPLANT
PACK BASIN MINOR ARMC (MISCELLANEOUS) ×2 IMPLANT
STRIP CLOSURE SKIN 1/2X4 (GAUZE/BANDAGES/DRESSINGS) ×2 IMPLANT
SUT PROLENE 0 CT 1 30 (SUTURE) ×2 IMPLANT
SUT SURGILON 0 BLK (SUTURE) ×4 IMPLANT
SUT VIC AB 3-0 54X BRD REEL (SUTURE) ×2 IMPLANT
SUT VIC AB 3-0 BRD 54 (SUTURE) ×2
SUT VIC AB 3-0 SH 27 (SUTURE) ×1
SUT VIC AB 3-0 SH 27X BRD (SUTURE) ×1 IMPLANT
SUT VIC AB 4-0 FS2 27 (SUTURE) ×2 IMPLANT
SWABSTK COMLB BENZOIN TINCTURE (MISCELLANEOUS) ×2 IMPLANT
SYR 10ML LL (SYRINGE) ×2 IMPLANT
SYR 3ML LL SCALE MARK (SYRINGE) ×2 IMPLANT

## 2017-09-20 NOTE — Discharge Instructions (Addendum)
  AMBULATORY SURGERY  DISCHARGE INSTRUCTIONS   1) The drugs that you were given will stay in your system until tomorrow so for the next 24 hours you should not:  A) Drive an automobile B) Make any legal decisions C) Drink any alcoholic beverage   2) You may resume regular meals tomorrow.  Today it is better to start with liquids and gradually work up to solid foods.  You may eat anything you prefer, but it is better to start with liquids, then soup and crackers, and gradually work up to solid foods.   3) Please notify your doctor immediately if you have any unusual bleeding, trouble breathing, redness and pain at the surgery site, drainage, fever, or pain not relieved by medication.    4) Additional Instructions: TAKE A STOOL SOFTENER TWICE A DAY WHILE TAKING NARCOTIC PAIN MEDICINE TO PREVENT CONSTIPATION   Please contact your physician with any problems or Same Day Surgery at [phone], Monday through Friday 6 am to 4 pm, or [facility] Main number at [phone].   

## 2017-09-20 NOTE — H&P (Signed)
No change in clinical history or exam. For umbilical hernia repair.  

## 2017-09-20 NOTE — Transfer of Care (Signed)
Immediate Anesthesia Transfer of Care Note  Patient: Marie Flores  Procedure(s) Performed: Procedure(s): HERNIA REPAIR UMBILICAL ADULT (N/A)  Patient Location: PACU  Anesthesia Type:General  Level of Consciousness: sedated  Airway & Oxygen Therapy: Patient Spontanous Breathing and Patient connected to face mask oxygen  Post-op Assessment: Report given to RN and Post -op Vital signs reviewed and stable  Post vital signs: Reviewed and stable  Last Vitals:  Vitals:   09/20/17 0841 09/20/17 0842  BP:  134/88  Pulse:  88  Resp: (P) 16 16  Temp: (P) 36.6 C 36.6 C  SpO2:  100%    Complications: No apparent anesthesia complications

## 2017-09-20 NOTE — Anesthesia Postprocedure Evaluation (Signed)
Anesthesia Post Note  Patient: Marie Flores  Procedure(s) Performed: HERNIA REPAIR UMBILICAL ADULT (N/A Abdomen)  Patient location during evaluation: PACU Anesthesia Type: General Level of consciousness: awake and alert Pain management: pain level controlled Vital Signs Assessment: post-procedure vital signs reviewed and stable Respiratory status: spontaneous breathing, nonlabored ventilation and respiratory function stable Cardiovascular status: blood pressure returned to baseline and stable Postop Assessment: no apparent nausea or vomiting Anesthetic complications: no     Last Vitals:  Vitals:   09/20/17 1031 09/20/17 1052  BP: (!) 91/55   Pulse: 94   Resp: 16   Temp:  (!) 36.3 C  SpO2: 96%     Last Pain:  Vitals:   09/20/17 1052  TempSrc: Temporal  PainSc:                  Christia ReadingScott T Tanita Palinkas

## 2017-09-20 NOTE — Anesthesia Procedure Notes (Addendum)
Procedure Name: LMA Insertion Date/Time: 09/20/2017 7:46 AM Performed by: Stormy Fabianurtis, Camelle Henkels, CRNA Pre-anesthesia Checklist: Patient identified, Patient being monitored, Timeout performed, Emergency Drugs available and Suction available Patient Re-evaluated:Patient Re-evaluated prior to induction Oxygen Delivery Method: Circle system utilized Preoxygenation: Pre-oxygenation with 100% oxygen Induction Type: IV induction Ventilation: Mask ventilation without difficulty LMA: LMA inserted LMA Size: 4.0 Tube type: Oral Number of attempts: 1 Placement Confirmation: positive ETCO2 and breath sounds checked- equal and bilateral Tube secured with: Tape Dental Injury: Teeth and Oropharynx as per pre-operative assessment

## 2017-09-20 NOTE — Anesthesia Post-op Follow-up Note (Signed)
Anesthesia QCDR form completed.        

## 2017-09-20 NOTE — Op Note (Signed)
Preoperative diagnosis: Umbilical hernia with partially incarcerated omentum.  Postoperative diagnosis: Same with multiple fascial defect.  Operative procedure: Repair of umbilical hernia with 8 cm ventral light ST mesh.  Operating Surgeon: Donnalee CurryJeffrey Meredyth Hornung, MD.  Anesthesia: General by LMA, Marcaine 0.5%, plain, 30 cc.  Estimated blood loss: 5 cc.  Clinical note: This 32 year old woman is developed a symptomatic umbilical hernia.  She was admitted for elective repair.  She received Kefzol prior to the procedure anticipating the possible need for a mesh repair.  SCD stockings for DVT prevention.  Operative note: The area was cleansed with ChloraPrep and draped.  Field block anesthesia was placed for postoperative analgesia.  An infraumbilical incision was made carried down the skin subtendinous tissue with hemostasis achieved by electrocautery.  The umbilical skin was elevated off these hernia sac.  The sac was transected at the fascial level.  The defect measured approximately 2.5 cm in diameter.  Small 4-5 mm defect to the left in superior to the primary defect were noted.  The superior defect was closed with a 0 Surgilon simple suture and the lateral defect made part of the primary defect.  It was elected to place an 8 cm ventral light ST mesh and intraperitoneal position.  This was anchored with 4 corner sutures of 0 Surgilon to minimize migration.  The primary fascial defect was then closed transversely with interrupted 0 Surgilon sutures placed under direct vision.  A small bite of the mesh was taken with each suture.  The adipose layer was approximated with a running 3-0 Vicryl suture in 2 layers.  The skin was closed with a running 4-0 Vicryl subcuticular suture.  Benzoin, Steri-Strips, Telfa and Tegaderm dressing applied.  Patient tolerated the procedure well was taken to recovery room in stable condition.

## 2017-09-30 ENCOUNTER — Encounter: Payer: Self-pay | Admitting: General Surgery

## 2017-09-30 ENCOUNTER — Ambulatory Visit (INDEPENDENT_AMBULATORY_CARE_PROVIDER_SITE_OTHER): Payer: BLUE CROSS/BLUE SHIELD | Admitting: General Surgery

## 2017-09-30 VITALS — BP 132/74 | HR 76 | Resp 14 | Ht 62.0 in | Wt 236.0 lb

## 2017-09-30 DIAGNOSIS — K429 Umbilical hernia without obstruction or gangrene: Secondary | ICD-10-CM

## 2017-09-30 NOTE — Progress Notes (Signed)
Patient ID: Marie Flores, female   DOB: 10/03/1985, 32 y.o.   MRN: [id number]  Chief Complaint  Patient presents with  . Routine Post Op    HPI Marie Flores is a 32 y.o. female here today for her post op umbilical hernia repair done on 09/20/2017. Patient states she is doing well. Just a little. Sore.  HPI  Past Medical History:  Diagnosis Date  . Anemia    as a Occupational hygienistteeenager  . Family history of adverse reaction to anesthesia    mom-hard to wake up  . GERD (gastroesophageal reflux disease)    tums prn  . Gestational diabetes    cleared November 04, 2015  . Headache    migraines-hormonal  . History of kidney stones    h/o  . Hx of varicella   . Morbid obesity with BMI of 40.0-44.9, adult (HCC)   . Polycystic ovarian syndrome   . Scoliosis   . Umbilical hernia 09/2017    Past Surgical History:  Procedure Laterality Date  . TONSILLECTOMY    . UMBILICAL HERNIA REPAIR N/A 09/20/2017   Procedure: HERNIA REPAIR UMBILICAL ADULT;  Surgeon: Earline MayotteByrnett, Sigurd Pugh W, MD;  Location: [facility] ORS;  Service: General;  Laterality: N/A;  . WISDOM TOOTH EXTRACTION      Family History  Problem Relation Age of Onset  . Asthma Mother   . Diabetes Mother   . Hyperlipidemia Father   . Heart failure Father   . Hypertension Father   . Diabetes Father   . Stroke Paternal Grandfather   . Bladder Cancer Paternal Grandmother     Social History Social History   Tobacco Use  . Smoking status: Never Smoker  . Smokeless tobacco: Never Used  Substance Use Topics  . Alcohol use: No  . Drug use: No    Allergies  Allergen Reactions  . Amoxicillin Hives and Other (See Comments)    Reaction:  GI upset  Has patient had a PCN reaction causing immediate rash, facial/tongue/throat swelling, SOB or lightheadedness with hypotension: No Has patient had a PCN reaction causing severe rash involving mucus membranes or skin necrosis: No Has patient had a PCN reaction that required  hospitalization No Has patient had a PCN reaction occurring within the last 10 years: Yes If all of the above answers are "NO", then may proceed with Cephalosporin use.  . Glyburide Nausea And Vomiting    fainting  . Adhesive [Tape] Other (See Comments)    Sensitive skin. bandaids cause rash. Please use paper tape  . Dairy Aid [Lactase] Other (See Comments)    Bloating, stomach upset  . Wheat Bran Itching    Headaches,    Current Outpatient Medications  Medication Sig Dispense Refill  . Ascorbic Acid (VITAMIN C) 1000 MG tablet Take 1,000 mg by mouth at bedtime.    . calcium carbonate (TUMS - DOSED IN MG ELEMENTAL CALCIUM) 500 MG chewable tablet Chew 1 tablet by mouth as needed for indigestion or heartburn.    . Cholecalciferol (VITAMIN D3) 5000 units CAPS Take 5,000 Units by mouth at bedtime.     . fexofenadine (ALLEGRA) 180 MG tablet Take 180 mg by mouth daily as needed for allergies.    Marland Kitchen. HOMEOPATHIC PRODUCTS OP Place 1-2 drops into both eyes 3 (three) times daily as needed (for allergy eye/irritation). Similasan Allergy Eye Relief Eye Drops    . ibuprofen (ADVIL,MOTRIN) 200 MG tablet Take 400-800 mg by mouth every 6 (six) hours as needed (pain/headache/migraine).    .Marland Kitchen  Multiple Vitamins-Minerals (ADULT GUMMY PO) Take 2 tablets by mouth daily with lunch. VitaFusion Gummy Vitamin     No current facility-administered medications for this visit.     Review of Systems Review of Systems  Constitutional: Negative.   Respiratory: Negative.   Cardiovascular: Negative.     Blood pressure 132/74, pulse 76, resp. rate 14, height 5\' 2"  (1.575 m), weight 236 lb (107 kg), unknown if currently breastfeeding.  Physical Exam Physical Exam  Constitutional: She is oriented to person, place, and time. She appears well-developed and well-nourished.  Abdominal:  Hernia repair intact and healing well.   Neurological: She is alert and oriented to person, place, and time.  Skin: Skin is warm and  dry.       Assessment    Doing well post umbilical hernia repair.    Plan   Return as needed.Proper lifting techniques reviewed. The patient is aware to call back for any questions or concerns.  HPI, Physical Exam, Assessment and Plan have been scribed under the direction and in the presence of Donnalee Curry, MD.  Ples Specter, CMA  I have completed the exam and reviewed the above documentation for accuracy and completeness.  I agree with the above.  Museum/gallery conservator has been used and any errors in dictation or transcription are unintentional.  Donnalee Curry, M.D., F.A.C.S.  Marie Flores 10/02/2017, 1:03 PM

## 2017-09-30 NOTE — Patient Instructions (Signed)
  Return as needed. Proper lifting techniques reviewed. The patient is aware to call back for any questions or concerns.    

## 2017-10-09 ENCOUNTER — Emergency Department: Payer: BLUE CROSS/BLUE SHIELD | Admitting: Anesthesiology

## 2017-10-09 ENCOUNTER — Encounter: Payer: Self-pay | Admitting: Radiology

## 2017-10-09 ENCOUNTER — Emergency Department: Payer: BLUE CROSS/BLUE SHIELD

## 2017-10-09 ENCOUNTER — Observation Stay
Admission: EM | Admit: 2017-10-09 | Discharge: 2017-10-11 | Disposition: A | Discharge status: Home / Self Care | Payer: BLUE CROSS/BLUE SHIELD | Attending: General Surgery | Admitting: General Surgery

## 2017-10-09 ENCOUNTER — Other Ambulatory Visit: Payer: Self-pay

## 2017-10-09 ENCOUNTER — Encounter
Admission: EM | Disposition: A | Discharge status: Home / Self Care | Payer: Self-pay | Source: Home / Self Care | Attending: Emergency Medicine

## 2017-10-09 DIAGNOSIS — T8149XA Infection following a procedure, other surgical site, initial encounter: Secondary | ICD-10-CM | POA: Diagnosis present

## 2017-10-09 DIAGNOSIS — T8141XA Infection following a procedure, superficial incisional surgical site, initial encounter: Secondary | ICD-10-CM | POA: Diagnosis not present

## 2017-10-09 DIAGNOSIS — K651 Peritoneal abscess: Secondary | ICD-10-CM

## 2017-10-09 DIAGNOSIS — L02216 Cutaneous abscess of umbilicus: Secondary | ICD-10-CM | POA: Diagnosis not present

## 2017-10-09 DIAGNOSIS — Z79899 Other long term (current) drug therapy: Secondary | ICD-10-CM | POA: Insufficient documentation

## 2017-10-09 DIAGNOSIS — Z6841 Body Mass Index (BMI) 40.0 and over, adult: Secondary | ICD-10-CM | POA: Insufficient documentation

## 2017-10-09 DIAGNOSIS — Y838 Other surgical procedures as the cause of abnormal reaction of the patient, or of later complication, without mention of misadventure at the time of the procedure: Secondary | ICD-10-CM | POA: Diagnosis not present

## 2017-10-09 DIAGNOSIS — Z794 Long term (current) use of insulin: Secondary | ICD-10-CM | POA: Diagnosis not present

## 2017-10-09 DIAGNOSIS — K219 Gastro-esophageal reflux disease without esophagitis: Secondary | ICD-10-CM | POA: Diagnosis not present

## 2017-10-09 HISTORY — PX: INCISION AND DRAINAGE ABSCESS: SHX5864

## 2017-10-09 LAB — CBC WITH DIFFERENTIAL/PLATELET
BASOS ABS: 0.1 10*3/uL (ref 0–0.1)
Basophils Relative: 1 %
EOS PCT: 2 %
Eosinophils Absolute: 0.3 10*3/uL (ref 0–0.7)
HEMATOCRIT: 41.9 % (ref 35.0–47.0)
HEMOGLOBIN: 14.2 g/dL (ref 12.0–16.0)
LYMPHS PCT: 19 %
Lymphs Abs: 2.3 10*3/uL (ref 1.0–3.6)
MCH: 29.1 pg (ref 26.0–34.0)
MCHC: 33.9 g/dL (ref 32.0–36.0)
MCV: 85.8 fL (ref 80.0–100.0)
Monocytes Absolute: 0.5 10*3/uL (ref 0.2–0.9)
Monocytes Relative: 4 %
NEUTROS ABS: 9 10*3/uL — AB (ref 1.4–6.5)
NEUTROS PCT: 74 %
PLATELETS: 412 10*3/uL (ref 150–440)
RBC: 4.89 MIL/uL (ref 3.80–5.20)
RDW: 13.5 % (ref 11.5–14.5)
WBC: 12.1 10*3/uL — AB (ref 3.6–11.0)

## 2017-10-09 LAB — LACTIC ACID, PLASMA: Lactic Acid, Venous: 1.5 mmol/L (ref 0.5–1.9)

## 2017-10-09 LAB — BASIC METABOLIC PANEL
ANION GAP: 10 (ref 5–15)
BUN: 10 mg/dL (ref 6–20)
CHLORIDE: 104 mmol/L (ref 101–111)
CO2: 24 mmol/L (ref 22–32)
Calcium: 9.2 mg/dL (ref 8.9–10.3)
Creatinine, Ser: 0.97 mg/dL (ref 0.44–1.00)
GFR calc Af Amer: >60 mL/min (ref >60)
GLUCOSE: 154 mg/dL — AB (ref 65–99)
POTASSIUM: 4.2 mmol/L (ref 3.5–5.1)
Sodium: 138 mmol/L (ref 135–145)

## 2017-10-09 LAB — POCT PREGNANCY, URINE
Preg Test, Ur: NEGATIVE
Preg Test, Ur: NEGATIVE

## 2017-10-09 SURGERY — INCISION AND DRAINAGE, ABSCESS
Anesthesia: General | Wound class: Dirty or Infected

## 2017-10-09 MED ORDER — HYDROCODONE-ACETAMINOPHEN 5-325 MG PO TABS
1.0000 | ORAL_TABLET | ORAL | Status: DC | PRN
  Administered 2017-10-10 – 2017-10-11 (×3): 1 via ORAL
  Filled 2017-10-09 (×3): qty 1

## 2017-10-09 MED ORDER — OXYCODONE HCL 5 MG PO TABS: 5.0000 mg | ORAL_TABLET | Freq: Once | ORAL | Status: DC | PRN

## 2017-10-09 MED ORDER — SODIUM CHLORIDE 0.9 % IV SOLN
1.0000 g | Freq: Once | INTRAVENOUS | Status: AC
  Administered 2017-10-09: 1 g via INTRAVENOUS
  Filled 2017-10-09: qty 1

## 2017-10-09 MED ORDER — ONDANSETRON HCL 4 MG/2ML IJ SOLN
INTRAMUSCULAR | Status: AC
  Filled 2017-10-09: qty 2

## 2017-10-09 MED ORDER — BISACODYL 5 MG PO TBEC
5.0000 mg | DELAYED_RELEASE_TABLET | Freq: Every day | ORAL | Status: DC
  Administered 2017-10-10 – 2017-10-11 (×2): 5 mg via ORAL
  Filled 2017-10-09 (×2): qty 1

## 2017-10-09 MED ORDER — ENOXAPARIN SODIUM 40 MG/0.4ML ~~LOC~~ SOLN
40.0000 mg | Freq: Two times a day (BID) | SUBCUTANEOUS | Status: DC
  Administered 2017-10-10 (×2): 40 mg via SUBCUTANEOUS
  Filled 2017-10-09 (×3): qty 0.4

## 2017-10-09 MED ORDER — MIDAZOLAM HCL 2 MG/2ML IJ SOLN
INTRAMUSCULAR | Status: DC | PRN
  Administered 2017-10-09: 2 mg via INTRAVENOUS

## 2017-10-09 MED ORDER — ONDANSETRON HCL 4 MG/2ML IJ SOLN: 4.0000 mg | Freq: Four times a day (QID) | INTRAMUSCULAR | Status: DC | PRN

## 2017-10-09 MED ORDER — MORPHINE SULFATE (PF) 4 MG/ML IV SOLN
4.0000 mg | Freq: Once | INTRAVENOUS | Status: AC
  Administered 2017-10-09: 4 mg via INTRAVENOUS
  Filled 2017-10-09 (×2): qty 1

## 2017-10-09 MED ORDER — ONDANSETRON HCL 4 MG/2ML IJ SOLN
INTRAMUSCULAR | Status: DC | PRN
  Administered 2017-10-09: 4 mg via INTRAVENOUS

## 2017-10-09 MED ORDER — MIDAZOLAM HCL 2 MG/2ML IJ SOLN
INTRAMUSCULAR | Status: AC
  Filled 2017-10-09: qty 2

## 2017-10-09 MED ORDER — DEXAMETHASONE SODIUM PHOSPHATE 10 MG/ML IJ SOLN
INTRAMUSCULAR | Status: AC
  Filled 2017-10-09: qty 1

## 2017-10-09 MED ORDER — VITAMIN C 500 MG PO TABS
1000.0000 mg | ORAL_TABLET | Freq: Every day | ORAL | Status: DC
  Filled 2017-10-09 (×2): qty 2

## 2017-10-09 MED ORDER — OXYCODONE HCL 5 MG/5ML PO SOLN: 5.0000 mg | Freq: Once | ORAL | Status: DC | PRN

## 2017-10-09 MED ORDER — FENTANYL CITRATE (PF) 100 MCG/2ML IJ SOLN
INTRAMUSCULAR | Status: DC | PRN
  Administered 2017-10-09: 25 ug via INTRAVENOUS
  Administered 2017-10-09: 50 ug via INTRAVENOUS
  Administered 2017-10-09 (×2): 25 ug via INTRAVENOUS
  Administered 2017-10-09: 50 ug via INTRAVENOUS
  Administered 2017-10-09: 25 ug via INTRAVENOUS

## 2017-10-09 MED ORDER — CALCIUM CARBONATE ANTACID 500 MG PO CHEW: 1.0000 | CHEWABLE_TABLET | ORAL | Status: DC | PRN

## 2017-10-09 MED ORDER — VANCOMYCIN HCL IN DEXTROSE 1-5 GM/200ML-% IV SOLN
1000.0000 mg | Freq: Two times a day (BID) | INTRAVENOUS | Status: DC
  Administered 2017-10-10 – 2017-10-11 (×3): 1000 mg via INTRAVENOUS
  Filled 2017-10-09 (×5): qty 200

## 2017-10-09 MED ORDER — PROPOFOL 500 MG/50ML IV EMUL
INTRAVENOUS | Status: DC | PRN
  Administered 2017-10-09: 125 ug/kg/min via INTRAVENOUS

## 2017-10-09 MED ORDER — DEXAMETHASONE SODIUM PHOSPHATE 10 MG/ML IJ SOLN
INTRAMUSCULAR | Status: DC | PRN
  Administered 2017-10-09: 10 mg via INTRAVENOUS

## 2017-10-09 MED ORDER — LACTATED RINGERS IV SOLN
INTRAVENOUS | Status: DC | PRN
  Administered 2017-10-09: 21:00:00 via INTRAVENOUS

## 2017-10-09 MED ORDER — BUPIVACAINE-EPINEPHRINE (PF) 0.5% -1:200000 IJ SOLN
INTRAMUSCULAR | Status: DC | PRN
  Administered 2017-10-09: 30 mL via PERINEURAL

## 2017-10-09 MED ORDER — BUPIVACAINE-EPINEPHRINE (PF) 0.5% -1:200000 IJ SOLN
INTRAMUSCULAR | Status: AC
  Filled 2017-10-09: qty 30

## 2017-10-09 MED ORDER — VANCOMYCIN HCL IN DEXTROSE 1-5 GM/200ML-% IV SOLN
1000.0000 mg | Freq: Once | INTRAVENOUS | Status: AC
  Administered 2017-10-09: 1000 mg via INTRAVENOUS
  Filled 2017-10-09: qty 200

## 2017-10-09 MED ORDER — FENTANYL CITRATE (PF) 100 MCG/2ML IJ SOLN
25.0000 ug | INTRAMUSCULAR | Status: DC | PRN
  Administered 2017-10-09 (×4): 25 ug via INTRAVENOUS

## 2017-10-09 MED ORDER — PROPOFOL 500 MG/50ML IV EMUL
INTRAVENOUS | Status: AC
Start: 2017-10-09 — End: ?
  Filled 2017-10-09: qty 50

## 2017-10-09 MED ORDER — ONDANSETRON HCL 4 MG/2ML IJ SOLN
4.0000 mg | Freq: Once | INTRAMUSCULAR | Status: AC
  Administered 2017-10-09: 4 mg via INTRAVENOUS
  Filled 2017-10-09 (×2): qty 2

## 2017-10-09 MED ORDER — LIDOCAINE HCL (PF) 1 % IJ SOLN
5.0000 mL | Freq: Once | INTRAMUSCULAR | Status: AC
  Administered 2017-10-09: 5 mL via INTRADERMAL
  Filled 2017-10-09: qty 5

## 2017-10-09 MED ORDER — IOPAMIDOL (ISOVUE-300) INJECTION 61%
100.0000 mL | Freq: Once | INTRAVENOUS | Status: AC | PRN
  Administered 2017-10-09: 100 mL via INTRAVENOUS

## 2017-10-09 MED ORDER — FENTANYL CITRATE (PF) 100 MCG/2ML IJ SOLN
INTRAMUSCULAR | Status: AC
  Filled 2017-10-09: qty 2

## 2017-10-09 MED ORDER — ONDANSETRON 4 MG PO TBDP: 4.0000 mg | ORAL_TABLET | Freq: Four times a day (QID) | ORAL | Status: DC | PRN

## 2017-10-09 MED ORDER — VITAMIN D3 125 MCG (5000 UT) PO CAPS: 5000.0000 [IU] | ORAL_CAPSULE | Freq: Every day | ORAL | Status: DC

## 2017-10-09 MED ORDER — MORPHINE SULFATE (PF) 4 MG/ML IV SOLN: 4.0000 mg | INTRAVENOUS | Status: DC | PRN

## 2017-10-09 MED ORDER — SODIUM CHLORIDE 0.9 % IV SOLN
INTRAVENOUS | Status: DC
  Administered 2017-10-09: 23:00:00 via INTRAVENOUS

## 2017-10-09 MED ORDER — FENTANYL CITRATE (PF) 100 MCG/2ML IJ SOLN
INTRAMUSCULAR | Status: AC
  Administered 2017-10-09: 25 ug via INTRAVENOUS
  Filled 2017-10-09: qty 2

## 2017-10-09 MED ORDER — MAGNESIUM CITRATE PO SOLN
1.0000 | Freq: Once | ORAL | Status: DC | PRN
  Filled 2017-10-09: qty 296

## 2017-10-09 MED ORDER — PROPOFOL 10 MG/ML IV BOLUS
INTRAVENOUS | Status: DC | PRN
  Administered 2017-10-09: 50 mg via INTRAVENOUS
  Administered 2017-10-09: 20 mg via INTRAVENOUS
  Administered 2017-10-09: 50 mg via INTRAVENOUS

## 2017-10-09 SURGICAL SUPPLY — 22 items
BLADE CLIPPER SURG (BLADE) ×2 IMPLANT
BLADE SURG 15 STRL LF DISP TIS (BLADE) ×1 IMPLANT
BLADE SURG 15 STRL SS (BLADE) ×1
BRUSH SCRUB EZ  4% CHG (MISCELLANEOUS) ×1
BRUSH SCRUB EZ 4% CHG (MISCELLANEOUS) ×1 IMPLANT
CANISTER SUCT 3000ML PPV (MISCELLANEOUS) ×2 IMPLANT
DRAPE CHEST BREAST 77X106 FENE (MISCELLANEOUS) IMPLANT
DRAPE LAPAROTOMY 77X122 PED (DRAPES) ×2 IMPLANT
ELECT REM PT RETURN 9FT ADLT (ELECTROSURGICAL) ×2
ELECTRODE REM PT RTRN 9FT ADLT (ELECTROSURGICAL) ×1 IMPLANT
GLOVE BIO SURGEON STRL SZ 6.5 (GLOVE) ×2 IMPLANT
GLOVE BIOGEL PI IND STRL 6.5 (GLOVE) ×1 IMPLANT
GLOVE BIOGEL PI INDICATOR 6.5 (GLOVE) ×1
GOWN STRL REUS W/ TWL LRG LVL3 (GOWN DISPOSABLE) ×2 IMPLANT
GOWN STRL REUS W/TWL LRG LVL3 (GOWN DISPOSABLE) ×2
NEEDLE HYPO 22GX1.5 SAFETY (NEEDLE) ×2 IMPLANT
NS IRRIG 1000ML POUR BTL (IV SOLUTION) ×2 IMPLANT
PACK BASIN MINOR ARMC (MISCELLANEOUS) ×2 IMPLANT
SCRUB POVIDONE IODINE 4 OZ (MISCELLANEOUS) ×2 IMPLANT
SOL PREP PVP 2OZ (MISCELLANEOUS) ×2
SOLUTION PREP PVP 2OZ (MISCELLANEOUS) ×1 IMPLANT
SPONGE LAP 18X18 RF (DISPOSABLE) ×2 IMPLANT

## 2017-10-09 NOTE — ED Notes (Signed)
Pt transported to CT at this time.

## 2017-10-09 NOTE — Op Note (Signed)
Preoperative diagnosis: Periumbilical wound abscess   Postoperative diagnosis: Periumbilical wound abscess  Procedure: Incision and drainage of periumbilical wound abscess.  Anesthesia: Local and sedation  Surgeon: Dr. Hazle Quantintron-Diaz, MD  Wound Classification: Contaminated  Indications: Patient is a 32 y.o. female  presented with an abscess on the periumbilical area on the surgical site of previous umbilical hernia repair with mesh. Patient very tender to palpation, warm to touch, draining purulent secretions and with leukocytosis. In  Findings:  1. Abscess on the periumbilical area 2. Abundant purulent secretions drained and cultured 3. Adequate hemostasis.   Description of procedure: The patient was placed in the supine position and monitored sedation was given. The abdomen was prepped and draped in the usual sterile fashion. A timeout was completed verifying correct patient, procedure, site, positioning, and implant(s) and/or special equipment prior to beginning this procedure.  A skin incision was made on the previous incision on the umbilical area. The wound was opened and purulent secretions was drained and cultured. With a hemostat blunt dissection of septas was done to be able to drain the abscess completely. The fascia was found intact. The cavity was irrigated with bethadine solution and peroxide mixtures. The cavity flushed with saline until clear saline was aspirated. The wound was packed with an iodine packing and wound left opened. Sterile dressing left in place. Local anesthesia infiltrated away of the infected area to block the area.  The patient tolerated the procedure well and was taken to the postanesthesia care unit in satisfactory condition.   Specimen: none  Complications: None  Estimated Blood Loss: Minimal

## 2017-10-09 NOTE — ED Triage Notes (Signed)
Pt to ED via POV, pt states that she had surgery on June 3rd to repair an umbilical hernia. Pt states that she had follow up with her Surgeon last Thursday (June 13th) and at the time the site looked good. Pt states that about 2-3 days ago the site started getting red and painful and that she has noticed that a knot seems to be forming around the incision site and she has seen some purulent drainage. Pt is in NAD at this time.

## 2017-10-09 NOTE — ED Notes (Signed)
Dr. Maia Planintron at bedside at this time to perform I&D.

## 2017-10-09 NOTE — ED Notes (Signed)
Report from [place], rn. Surgeon currently at bedside.

## 2017-10-09 NOTE — Anesthesia Post-op Follow-up Note (Signed)
Anesthesia QCDR form completed.        

## 2017-10-09 NOTE — ED Provider Notes (Signed)
[facility] Emergency Department Provider Note  ____________________________________________  Time seen: Approximately 4:34 PM  I have reviewed the triage vital signs and the nursing notes.   HISTORY  Chief Complaint surgical site infection   HPI Marie Flores is a 32 y.o. female POD 19 from umbilical hernia repair with mesh by Dr. Lemar Livings who presents for evaluation of surgical site infection.  Patient reports 2 days ago she started having pain and noted redness and purulent discharge from her umbilicus.  She is complaining of chills, nausea, and dull constant pain surrounding the surgical site which is currently 6 out of 10.  No fever or vomiting.   Past Medical History:  Diagnosis Date  . Anemia    as a Occupational hygienist  . Family history of adverse reaction to anesthesia    mom-hard to wake up  . GERD (gastroesophageal reflux disease)    tums prn  . Gestational diabetes    cleared November 04, 2015  . Headache    migraines-hormonal  . History of kidney stones    h/o  . Hx of varicella   . Morbid obesity with BMI of 40.0-44.9, adult (HCC)   . Polycystic ovarian syndrome   . Scoliosis   . Umbilical hernia 09/2017    Patient Active Problem List   Diagnosis Date Noted  . Gastroesophageal reflux disease 09/07/2017  . Polycystic ovarian syndrome   . Morbid obesity with BMI of 40.0-44.9, adult (HCC)   . Nausea without vomiting 07/30/2017  . Umbilical hernia without obstruction and without gangrene 07/09/2017  . Gestational diabetes mellitus (GDM) affecting pregnancy 11/02/2015    Past Surgical History:  Procedure Laterality Date  . TONSILLECTOMY    . UMBILICAL HERNIA REPAIR N/A 09/20/2017   Procedure: HERNIA REPAIR UMBILICAL ADULT;  Surgeon: Earline Mayotte, MD;  Location: [facility] ORS;  Service: General;  Laterality: N/A;  . WISDOM TOOTH EXTRACTION      Prior to Admission medications   Medication Sig Start Date End Date Taking?  Authorizing Provider  Ascorbic Acid (VITAMIN C) 1000 MG tablet Take 1,000 mg by mouth at bedtime.    [provider]  calcium carbonate (TUMS - DOSED IN MG ELEMENTAL CALCIUM) 500 MG chewable tablet Chew 1 tablet by mouth as needed for indigestion or heartburn.    [provider]  Cholecalciferol (VITAMIN D3) 5000 units CAPS Take 5,000 Units by mouth at bedtime.     [provider]  fexofenadine (ALLEGRA) 180 MG tablet Take 180 mg by mouth daily as needed for allergies.    [provider]  HOMEOPATHIC PRODUCTS OP Place 1-2 drops into both eyes 3 (three) times daily as needed (for allergy eye/irritation). Similasan Allergy Eye Relief Eye Drops    [provider]  ibuprofen (ADVIL,MOTRIN) 200 MG tablet Take 400-800 mg by mouth every 6 (six) hours as needed (pain/headache/migraine).    [provider]  Multiple Vitamins-Minerals (ADULT GUMMY PO) Take 2 tablets by mouth daily with lunch. VitaFusion Gummy Vitamin    [provider]    Allergies Amoxicillin; Glyburide; Adhesive [tape]; Dairy aid [lactase]; and Wheat bran  Family History  Problem Relation Age of Onset  . Asthma Mother   . Diabetes Mother   . Hyperlipidemia Father   . Heart failure Father   . Hypertension Father   . Diabetes Father   . Stroke Paternal Grandfather   . Bladder Cancer Paternal Grandmother     Social History Social History   Tobacco Use  .  Smoking status: Never Smoker  . Smokeless tobacco: Never Used  Substance Use Topics  . Alcohol use: No  . Drug use: No    Review of Systems  Constitutional: Negative for fever. + chills Eyes: Negative for visual changes. ENT: Negative for sore throat. Neck: No neck pain  Cardiovascular: Negative for chest pain. Respiratory: Negative for shortness of breath. Gastrointestinal: Negative for abdominal pain, vomiting or diarrhea. + surgical site infection and nausea Genitourinary: Negative for  dysuria. Musculoskeletal: Negative for back pain. Skin: Negative for rash.  Neurological: Negative for headaches, weakness or numbness. Psych: No SI or HI  ____________________________________________   PHYSICAL EXAM:  VITAL SIGNS: ED Triage Vitals  Enc Vitals Group     BP 10/09/17 1523 125/81     Pulse Rate 10/09/17 1523 (!) 112     Resp 10/09/17 1523 16     Temp 10/09/17 1523 97.6 F (36.4 C)     Temp Source 10/09/17 1523 Oral     SpO2 10/09/17 1523 97 %     Weight 10/09/17 1520 235 lb (106.6 kg)     Height 10/09/17 1520 5\' 2"  (1.575 m)     Head Circumference --      Peak Flow --      Pain Score 10/09/17 1519 7     Pain Loc --      Pain Edu? --      Excl. in GC? --     Constitutional: Alert and oriented. Well appearing and in no apparent distress. HEENT:      Head: Normocephalic and atraumatic.         Eyes: Conjunctivae are normal. Sclera is non-icteric.       Mouth/Throat: Mucous membranes are moist.       Neck: Supple with no signs of meningismus. Cardiovascular: Tachycardic with regular rhythm. No murmurs, gallops, or rubs. 2+ symmetrical distal pulses are present in all extremities. No JVD. Respiratory: Normal respiratory effort. Lungs are clear to auscultation bilaterally. No wheezes, crackles, or rhonchi.  Gastrointestinal: There is erythema, induration, and warmth surrounding the umbilical surgical site with small amount of purulent discharge coming from the umbilicus, that area is tender to palpation, remaining of her abdomen is soft with no tenderness, there is no rebound or guarding.   Musculoskeletal: Nontender with normal range of motion in all extremities. No edema, cyanosis, or erythema of extremities. Neurologic: Normal speech and language. Face is symmetric. Moving all extremities. No gross focal neurologic deficits are appreciated. Skin: Skin is warm, dry and intact. No rash noted. Psychiatric: Mood and affect are normal. Speech and behavior are  normal.  ____________________________________________   LABS (all labs ordered are listed, but only abnormal results are displayed)  Labs Reviewed  CBC WITH DIFFERENTIAL/PLATELET - Abnormal; Notable for the following components:      Result Value   WBC 12.1 (*)    Neutro Abs 9.0 (*)    All other components within normal limits  BASIC METABOLIC PANEL - Abnormal; Notable for the following components:   Glucose, Bld 154 (*)    All other components within normal limits  CULTURE, BLOOD (ROUTINE X 2)  CULTURE, BLOOD (ROUTINE X 2)  LACTIC ACID, PLASMA  POCT PREGNANCY, URINE   ____________________________________________  EKG  none  ____________________________________________  RADIOLOGY  I have personally reviewed the images performed during this visit and I agree with the Radiologist's read.   Interpretation by Radiologist:  Ct Abdomen Pelvis W Contrast  Result Date: 10/09/2017 CLINICAL DATA:  Redness and pain at the site of an umbilical hernia repair performed on 09/20/2017. She reports focal swelling at that location with some purulent drainage. EXAM: CT ABDOMEN AND PELVIS WITH CONTRAST TECHNIQUE: Multidetector CT imaging of the abdomen and pelvis was performed using the standard protocol following bolus administration of intravenous contrast. CONTRAST:  ISOVUE-300 IOPAMIDOL (ISOVUE-300) INJECTION 61% COMPARISON:  Right upper quadrant abdomen ultrasound dated 06/24/2015. FINDINGS: Lower chest: Unremarkable. Hepatobiliary: Diffuse, mild geographical low density in the right lobe of the liver. Poorly distended, normal appearing gallbladder. Pancreas: Unremarkable. No pancreatic ductal dilatation or surrounding inflammatory changes. Spleen: Normal in size without focal abnormality. Adrenals/Urinary Tract: Adrenal glands are unremarkable. Two small lower pole left renal calculi. The largest measures 5 mm in maximum diameter. Otherwise, the kidneys are normal, without renal calculi,  focal lesion, or hydronephrosis. Bladder is unremarkable. Stomach/Bowel: Unremarkable stomach, small bowel and colon. No evidence of appendicitis. Vascular/Lymphatic: No significant vascular findings are present. No enlarged abdominal or pelvic lymph nodes. Reproductive: Uterus and bilateral adnexa are unremarkable. Other: Fluid collection in the posterior aspect of the umbilicus measuring 2.0 x 1.4 x 0.8 cm. This has a small communication with an underlying fluid collection beneath the anterior abdominal wall, measuring 5.9 x 5.9 x 3.1 cm. This has a thin surrounding mildly enhancing wall. There is also adjacent stranding in the anterior abdominal fat. Musculoskeletal: Mild lower thoracic spine degenerative changes. IMPRESSION: 1. 2.0 x 1.4 x 0.8 cm fluid collection in the posterior aspect of the umbilicus, communicating with an anterior abdominal fluid collection measuring 5.9 x 5.9 x 3.1 cm. These have CT appearances compatible with abscess. 2. Two small, nonobstructing lower pole left renal calculi. 3. Diffuse right lobe hepatic steatosis. Electronically Signed   By: Beckie Salts M.D.   On: 10/09/2017 18:19      ____________________________________________   PROCEDURES  Procedure(s) performed: None Procedures Critical Care performed:  None ____________________________________________   INITIAL IMPRESSION / ASSESSMENT AND PLAN / ED COURSE  32 y.o. female POD 20 from umbilical hernia repair with mesh by Dr. Lemar Livings who presents for evaluation of surgical site infection.  Patient has obvious cellulitis surrounding the surgical site but also purulent discharge and induration of the skin.  She is afebrile with tachycardia, has a white count of 12.  Discussed with Dr. Maia Plan, on call for surgery who recommended CT scan to rule out deep intra-abdominal infection.  We will send lactic and blood cultures.  Will give cefepime and vancomycin for postsurgical infection.  Patient does not meet sepsis  criteria at this time.    _________________________ 6:23 PM on 10/09/2017 -----------------------------------------  CT concerning for intra-abdominal abscess.  Patient be admitted to the surgical team for drainage.   As part of my medical decision making, I reviewed the following data within the electronic MEDICAL RECORD NUMBER Nursing notes reviewed and incorporated, Labs reviewed , Old chart reviewed, Radiograph reviewed , A consult was requested and obtained from this/these consultant(s) Surgery, Notes from prior ED visits and [place] Controlled Substance Database    Pertinent labs & imaging results that were available during my care of the patient were reviewed by me and considered in my medical decision making (see chart for details).    ____________________________________________   FINAL CLINICAL IMPRESSION(S) / ED DIAGNOSES  Final diagnoses:  Post-operative wound abscess  Intra-abdominal abscess (HCC)      NEW MEDICATIONS STARTED DURING THIS VISIT:  ED Discharge Orders    None       Note:  This document was prepared using Dragon voice recognition software and may include unintentional dictation errors.     Don PerkingVeronese, [place], MD 10/09/17 [phone]

## 2017-10-09 NOTE — Progress Notes (Signed)
Anticoagulation monitoring(Lovenox):   32 yo female ordered Lovenox 40 mg Q24h  Filed Weights   10/09/17 1520 10/09/17 2233  Weight: 235 lb (106.6 kg) 242 lb 9.6 oz (110 kg)   BMI 44.36   Lab Results  Component Value Date   CREATININE 0.97 10/09/2017   CREATININE 0.60 09/16/2017   CREATININE 0.70 04/04/2015   Estimated Creatinine Clearance: 97.4 mL/min (by C-G formula based on SCr of 0.97 mg/dL). Hemoglobin & Hematocrit     Component Value Date/Time   HGB 14.2 10/09/2017 1524   HCT 41.9 10/09/2017 1524     Per Protocol for Patient with estCrcl > 30 ml/min and BMI > 40, will transition to Lovenox 40 mg Q12h.

## 2017-10-09 NOTE — Anesthesia Postprocedure Evaluation (Signed)
Anesthesia Post Note  Patient: Joella Princeiffany Lynn Bezdek  Procedure(s) Performed: INCISION AND DRAINAGE ABSCESS (N/A )  Patient location during evaluation: PACU Anesthesia Type: General Level of consciousness: awake and alert Pain management: pain level controlled Vital Signs Assessment: post-procedure vital signs reviewed and stable Respiratory status: spontaneous breathing, nonlabored ventilation, respiratory function stable and patient connected to nasal cannula oxygen Cardiovascular status: blood pressure returned to baseline and stable Postop Assessment: no apparent nausea or vomiting Anesthetic complications: no     Last Vitals:  Vitals:   10/09/17 2221 10/09/17 2233  BP: 105/67 129/85  Pulse: 96 88  Resp: 11 16  Temp:  (!) 36.3 C  SpO2: 94% 96%    Last Pain:  Vitals:   10/09/17 2233  TempSrc: Oral  PainSc:                  Cleda MccreedyJoseph K Robbye Dede

## 2017-10-09 NOTE — ED Notes (Signed)
Kim, rn in to prep pt for surgery.

## 2017-10-09 NOTE — Transfer of Care (Signed)
Immediate Anesthesia Transfer of Care Note  Patient: Marie Flores  Procedure(s) Performed: INCISION AND DRAINAGE ABSCESS (N/A )  Patient Location: PACU  Anesthesia Type:General  Level of Consciousness: awake, alert  and oriented  Airway & Oxygen Therapy: Patient Spontanous Breathing and Patient connected to face mask oxygen  Post-op Assessment: Report given to RN and Post -op Vital signs reviewed and stable  Post vital signs: Reviewed and stable  Last Vitals:  Vitals Value Taken Time  BP    Temp    Pulse 104 10/09/2017  9:42 PM  Resp 13 10/09/2017  9:42 PM  SpO2 98 % 10/09/2017  9:42 PM  Vitals shown include unvalidated device data.  Last Pain:  Vitals:   10/09/17 1922  TempSrc:   PainSc: 7          Complications: No apparent anesthesia complications

## 2017-10-09 NOTE — ED Notes (Signed)
Report to [place], rn in or.

## 2017-10-09 NOTE — ED Notes (Signed)
Pt updated on care plan after speaking with RN in OR.

## 2017-10-09 NOTE — H&P (Signed)
SURGICAL CONSULTATION NOTE   HISTORY OF PRESENT ILLNESS (HPI):  32 y.o. female presented to [facility] ED for evaluation of wound abscess. Patient reports she had umbilical hernia repair with mesh on 09/20/17. She was recovering and healing well until three days ago when she started having worsening pain on the periumbilical area. Patient refers that the wound started to get red and warm and with purulent drainage. Patient refers that on post op visit she was doing really well. Pain is localized to the umbilical area. Pain does not radiates to other part of the body. Pain is aggravated with anything that touches the umbilical area and certain movements. Pain sometimes is improved with ibuprofen. On the ER patient refers that the pain has not improved. Labs shows leukocytosis and Ct scan shows a collection on the surgical area. Denies fever or chills.  Surgery is consulted by Dr. Don PerkingVeronese in this context for evaluation and management of wound abscess.  PAST MEDICAL HISTORY (PMH):  Past Medical History:  Diagnosis Date  . Anemia    as a Occupational hygienistteeenager  . Family history of adverse reaction to anesthesia    mom-hard to wake up  . GERD (gastroesophageal reflux disease)    tums prn  . Gestational diabetes    cleared November 04, 2015  . Headache    migraines-hormonal  . History of kidney stones    h/o  . Hx of varicella   . Morbid obesity with BMI of 40.0-44.9, adult (HCC)   . Polycystic ovarian syndrome   . Scoliosis   . Umbilical hernia 09/2017     PAST SURGICAL HISTORY [facility]):  Past Surgical History:  Procedure Laterality Date  . TONSILLECTOMY    . UMBILICAL HERNIA REPAIR N/A 09/20/2017   Procedure: HERNIA REPAIR UMBILICAL ADULT;  Surgeon: Earline MayotteByrnett, Jeffrey W, MD;  Location: [facility] ORS;  Service: General;  Laterality: N/A;  . WISDOM TOOTH EXTRACTION       MEDICATIONS:  Prior to Admission medications   Medication Sig Start Date End Date Taking? Authorizing Provider  Ascorbic Acid (VITAMIN C) 1000 MG  tablet Take 1,000 mg by mouth at bedtime.   Yes [provider]  Cholecalciferol (VITAMIN D3) 5000 units CAPS Take 5,000 Units by mouth at bedtime.    Yes [provider]  ibuprofen (ADVIL,MOTRIN) 200 MG tablet Take 400-800 mg by mouth every 6 (six) hours as needed (pain/headache/migraine).   Yes [provider]  Multiple Vitamins-Minerals (ADULT GUMMY PO) Take 2 tablets by mouth daily with lunch. VitaFusion Gummy Vitamin   Yes [provider]  calcium carbonate (TUMS - DOSED IN MG ELEMENTAL CALCIUM) 500 MG chewable tablet Chew 1 tablet by mouth as needed for indigestion or heartburn.    [provider]  fexofenadine (ALLEGRA) 180 MG tablet Take 180 mg by mouth daily as needed for allergies.    [provider]  HOMEOPATHIC PRODUCTS OP Place 1-2 drops into both eyes 3 (three) times daily as needed (for allergy eye/irritation). Similasan Allergy Eye Relief Eye Drops    [provider]     ALLERGIES:  Allergies  Allergen Reactions  . Amoxicillin Hives and Other (See Comments)    Reaction:  GI upset  Has patient had a PCN reaction causing immediate rash, facial/tongue/throat swelling, SOB or lightheadedness with hypotension: No Has patient had a PCN reaction causing severe rash involving mucus membranes or skin necrosis: No Has patient had a PCN reaction that required hospitalization No Has patient had a PCN reaction occurring within  the last 10 years: Yes If all of the above answers are "NO", then may proceed with Cephalosporin use.  . Glyburide Nausea And Vomiting    fainting  . Adhesive [Tape] Other (See Comments)    Sensitive skin. bandaids cause rash. Please use paper tape  . Dairy Aid [Lactase] Other (See Comments)    Bloating, stomach upset  . Wheat Bran Itching    Headaches,     SOCIAL HISTORY:  Social History   Socioeconomic History  . Marital status: Married    Spouse name: Not on file  . Number of children: 2  .  Years of education: Not on file  . Highest education level: Not on file  Occupational History  . Not on file  Social Needs  . Financial resource strain: Not on file  . Food insecurity:    Worry: Not on file    Inability: Not on file  . Transportation needs:    Medical: Not on file    Non-medical: Not on file  Tobacco Use  . Smoking status: Never Smoker  . Smokeless tobacco: Never Used  Substance and Sexual Activity  . Alcohol use: No  . Drug use: No  . Sexual activity: Yes    Birth control/protection: None  Lifestyle  . Physical activity:    Days per week: 1 day    Minutes per session: 60 min  . Stress: Only a little  Relationships  . Social connections:    Talks on phone: Not on file    Gets together: Not on file    Attends religious service: Not on file    Active member of club or organization: Not on file    Attends meetings of clubs or organizations: Not on file    Relationship status: Not on file  . Intimate partner violence:    Fear of current or ex partner: Not on file    Emotionally abused: Not on file    Physically abused: Not on file    Forced sexual activity: Not on file  Other Topics Concern  . Not on file  Social History Narrative   Married.   2 children.   Graduated as a Oncologist.    Moved from [place].    Enjoys exercising, spending time with swimming.     The patient currently resides (home / rehab facility / nursing home): Home The patient normally is (ambulatory / bedbound): Ambulatory   FAMILY HISTORY:  Family History  Problem Relation Age of Onset  . Asthma Mother   . Diabetes Mother   . Hyperlipidemia Father   . Heart failure Father   . Hypertension Father   . Diabetes Father   . Stroke Paternal Grandfather   . Bladder Cancer Paternal Grandmother      REVIEW OF SYSTEMS:  Constitutional: denies weight loss, fever, chills, or sweats  Eyes: denies any other vision changes, history of eye injury  ENT: denies sore throat,  hearing problems  Respiratory: denies shortness of breath, wheezing  Cardiovascular: denies chest pain, palpitations  Gastrointestinal: Positive for abdominal pain, no N/V, no diarrhea. Positive for constipation with pain medication.  Genitourinary: denies burning with urination or urinary frequency Musculoskeletal: denies any other joint pains or cramps  Skin: Positive for redness on surgical area. Denies rashes.   Neurological: denies any other headache, dizziness, weakness  Psychiatric: denies any other depression, anxiety   All other review of systems were negative   VITAL SIGNS:  Temp:  [97.6 F (36.4 C)]  97.6 F (36.4 C) (06/22 1523) Pulse Rate:  [95-123] 123 (06/22 1902) Resp:  [16] 16 (06/22 1523) BP: (105-154)/(59-102) 154/102 (06/22 1902) SpO2:  [96 %-98 %] 96 % (06/22 1902) Weight:  [106.6 kg (235 lb)] 106.6 kg (235 lb) (06/22 1520)     Height: 5\' 2"  (157.5 cm) Weight: 106.6 kg (235 lb) BMI (Calculated): 42.97   INTAKE/OUTPUT:  This shift: No intake/output data recorded.  Last 2 shifts: @IOLAST2SHIFTS @   PHYSICAL EXAM:  Constitutional:  -- Normal body habitus  -- Awake, alert, and oriented x3  Eyes:  -- Pupils equally round and reactive to light  -- No scleral icterus  Ear, nose, and throat:  -- No jugular venous distension  Pulmonary:  -- No crackles  -- Equal breath sounds bilaterally -- Breathing non-labored at rest Cardiovascular:  -- S1, S2 present  -- No pericardial rubs Gastrointestinal:  -- Abdomen soft, nontender, non-distended, no guarding or rebound tenderness -- No abdominal masses appreciated, pulsatile or otherwise  -- Periumbilcal wound with redness, warm to touch and right edge with appearance of previous drainage. Dry at this moment.  Musculoskeletal and Integumentary:  -- Wounds: peri umbilical wound with redness, blanching erythema very tender to palpation and warm -- Extremities: no edema  Neurologic:  -- Motor function: intact and  symmetric -- Sensation: intact and symmetric   Labs:  CBC Latest Ref Rng & Units 10/09/2017 09/16/2017 11/03/2015  WBC 3.6 - 11.0 K/uL 12.1(H) - 14.3(H)  Hemoglobin 12.0 - 16.0 g/dL 96.0 45.4 11.5(L)  Hematocrit 35.0 - 47.0 % 41.9 - 34.7(L)  Platelets 150 - 440 K/uL 412 - 230   CMP Latest Ref Rng & Units 10/09/2017 09/16/2017 04/04/2015  Glucose 65 - 99 mg/dL 098(J) 191(Y) 782(N)  BUN 6 - 20 mg/dL 10 11 9   Creatinine 0.44 - 1.00 mg/dL 5.62 1.30 8.65  Sodium 135 - 145 mmol/L 138 137 134(L)  Potassium 3.5 - 5.1 mmol/L 4.2 3.8 3.4(L)  Chloride 101 - 111 mmol/L 104 105 101  CO2 22 - 32 mmol/L 24 22 22   Calcium 8.9 - 10.3 mg/dL 9.2 7.8(I) 6.9(G)  Total Protein 6.5 - 8.1 g/dL - - 7.3  Total Bilirubin 0.3 - 1.2 mg/dL - - 0.4  Alkaline Phos 38 - 126 U/L - - 66  AST 15 - 41 U/L - - 20  ALT 14 - 54 U/L - - 15   Imaging studies:  Ct scan of the abdomen personally reviewed showing fluid collection superficial and deep to the abdominal wall. There is some enhancement of the fluid. No free air or free fluid.   Assessment/Plan:  32 y.o. female with history of umbilical hernia repair with mesh on 09/20/17. Patient now with abscess seen on CT scan. There is leukocytosis, blanching erythema, tender to palpation, history of purulent drainage I consider that the patient needs incision and debridement. Procedure was discussed with patient and was attempted at bedside. Patient did not tolerated pain of the infiltration of local anesthesia and procedure needed to be aborted.  I discussed with the patient the alternative of doing the procedure in the OR with better sedation and pain control. Patient agreed to proceed with incision and drainage at the OR.  Patient was oriented that the goal of the drainage is no drain the abscess, and the collection and with the antibiotic therapy, trying to save the mesh. Patient oriented that sometimes if the mesh get infected and the incision and drainage with antibiotic does not  control the infection,  patient will need to have another surgery to remove the mesh. Patient refers understood. Will admit the patient to observation for procedure and antibiotic therapy.   All of the above findings and recommendations were discussed with the patient and her husband, and all of patient's and her husband's questions were answered to heir expressed satisfaction.  Gae Gallop, MD

## 2017-10-09 NOTE — Anesthesia Preprocedure Evaluation (Signed)
Anesthesia Evaluation  Patient identified by MRN, date of birth, ID band Patient awake    Reviewed: Allergy & Precautions, H&P , NPO status , Patient's Chart, lab work & pertinent test results  History of Anesthesia Complications (+) Family history of anesthesia reaction and history of anesthetic complications  Airway Mallampati: III  TM Distance: <3 FB Neck ROM: full    Dental  (+) Chipped, Poor Dentition   Pulmonary neg pulmonary ROS, neg shortness of breath,           Cardiovascular Exercise Tolerance: Good (-) angina(-) Past MI and (-) DOE negative cardio ROS       Neuro/Psych  Headaches, negative psych ROS   GI/Hepatic Neg liver ROS, GERD  Medicated and Controlled,  Endo/Other  diabetes, Type 2  Renal/GU negative Renal ROS  negative genitourinary   Musculoskeletal   Abdominal   Peds  Hematology negative hematology ROS (+)   Anesthesia Other Findings Past Medical History: No date: Anemia     Comment:  as a teeenager No date: Family history of adverse reaction to anesthesia     Comment:  mom-hard to wake up No date: GERD (gastroesophageal reflux disease)     Comment:  tums prn No date: Gestational diabetes     Comment:  cleared November 04, 2015 No date: Headache     Comment:  migraines-hormonal No date: History of kidney stones     Comment:  h/o No date: Hx of varicella No date: Morbid obesity with BMI of 40.0-44.9, adult (HCC) No date: Polycystic ovarian syndrome No date: Scoliosis 09/2017: Umbilical hernia  Past Surgical History: No date: TONSILLECTOMY 09/20/2017: UMBILICAL HERNIA REPAIR; N/A     Comment:  Procedure: HERNIA REPAIR UMBILICAL ADULT;  Surgeon:               Earline MayotteByrnett, Jeffrey W, MD;  Location: [facility] ORS;  Service:               General;  Laterality: N/A; No date: WISDOM TOOTH EXTRACTION  BMI    Body Mass Index:  42.98 kg/m      Reproductive/Obstetrics negative OB ROS                              Anesthesia Physical Anesthesia Plan  ASA: III  Anesthesia Plan: General   Post-op Pain Management:    Induction: Intravenous  PONV Risk Score and Plan: Propofol infusion and TIVA  Airway Management Planned: Natural Airway and Nasal Cannula  Additional Equipment:   Intra-op Plan:   Post-operative Plan:   Informed Consent: I have reviewed the patients History and Physical, chart, labs and discussed the procedure including the risks, benefits and alternatives for the proposed anesthesia with the patient or authorized representative who has indicated his/her understanding and acceptance.   Dental Advisory Given  Plan Discussed with: Anesthesiologist, CRNA and Surgeon  Anesthesia Plan Comments: (Patient consented for risks of anesthesia including but not limited to:  - adverse reactions to medications - risk of intubation if required - damage to teeth, lips or other oral mucosa - sore throat or hoarseness - Damage to heart, brain, lungs or loss of life  Patient voiced understanding.)        Anesthesia Quick Evaluation

## 2017-10-09 NOTE — ED Notes (Signed)
Pt states had hernia repair surgery several days ago, reports had follow up appt with surgeon and since then has had not develop to umbilical area with drainage to site. Pt with shiney drainage noted around her umbilical area with redness noted as well. Pt also c/o "knot" forming around her umbilical area at this time.

## 2017-10-10 ENCOUNTER — Encounter: Payer: Self-pay | Admitting: Nurse Practitioner

## 2017-10-10 LAB — CBC WITH DIFFERENTIAL/PLATELET
BASOS ABS: 0 10*3/uL (ref 0–0.1)
BASOS PCT: 0 %
EOS PCT: 0 %
Eosinophils Absolute: 0 10*3/uL (ref 0–0.7)
HEMATOCRIT: 41.9 % (ref 35.0–47.0)
Hemoglobin: 14.1 g/dL (ref 12.0–16.0)
LYMPHS PCT: 8 %
Lymphs Abs: 1.2 10*3/uL (ref 1.0–3.6)
MCH: 28.9 pg (ref 26.0–34.0)
MCHC: 33.6 g/dL (ref 32.0–36.0)
MCV: 85.9 fL (ref 80.0–100.0)
MONO ABS: 0.1 10*3/uL — AB (ref 0.2–0.9)
Monocytes Relative: 1 %
NEUTROS ABS: 15.2 10*3/uL — AB (ref 1.4–6.5)
Neutrophils Relative %: 91 %
PLATELETS: 407 10*3/uL (ref 150–440)
RBC: 4.88 MIL/uL (ref 3.80–5.20)
RDW: 13.6 % (ref 11.5–14.5)
WBC: 16.6 10*3/uL — AB (ref 3.6–11.0)

## 2017-10-10 MED ORDER — CIPROFLOXACIN HCL 500 MG PO TABS
500.0000 mg | ORAL_TABLET | Freq: Two times a day (BID) | ORAL | Status: DC
  Administered 2017-10-10 – 2017-10-11 (×3): 500 mg via ORAL
  Filled 2017-10-10 (×3): qty 1

## 2017-10-10 MED ORDER — DIAZEPAM 5 MG PO TABS
10.0000 mg | ORAL_TABLET | Freq: Once | ORAL | Status: AC
  Administered 2017-10-10: 10 mg via ORAL
  Filled 2017-10-10: qty 2

## 2017-10-10 NOTE — Progress Notes (Signed)
Events of yesterday reviewed. Afebrile since I&D. Minimal pain. Redness markedly improved. Packing removed.  CT reviewed. Question as to whether the deep pocket is infected. U/S shows the area is visible. Discussed aspiration under u/s guidance with the patient.

## 2017-10-10 NOTE — Progress Notes (Signed)
Procedure note:  Reviewed the CT scan completed on admission showed a lenticular shaped fluid collection below the umbilical area where a previously placed mesh was located.  To confirm there was no infection in the area of the mesh ultrasound-guided aspiration was proposed and accepted.  The area was cleansed with ChloraPrep.  1 cc of 2% Xylocaine was used.  A 22-gauge spinal needle was then advanced under ultrasound guidance into the fluid pocket where 5 cc of clear, pale, straw-colored fluid was obtained without blood.  This was sent for culture.  The procedure was tolerated well.  Impression post mesh seroma without evidence of infection, culture pending.

## 2017-10-11 ENCOUNTER — Encounter: Payer: Self-pay | Admitting: General Surgery

## 2017-10-11 MED ORDER — SULFAMETHOXAZOLE-TRIMETHOPRIM 800-160 MG PO TABS: 1.0000 | ORAL_TABLET | Freq: Two times a day (BID) | ORAL | 0 refills | Status: AC

## 2017-10-11 NOTE — Final Progress Note (Signed)
AVSS. Culture: staph, ID pending. Deep aspiration: Negative. Wound: Resolving redness, scant odor. Plan: D/C home on Bactrim DS.

## 2017-10-11 NOTE — Progress Notes (Signed)
Pt discharged per MD order. IV removed. Discharge instructions reviewed with pt. Pt verbalized understanding of how to change dressing, had practiced on previous shift with RN. Pt taken to car in wheelchair by staff.

## 2017-10-14 LAB — CULTURE, BLOOD (ROUTINE X 2)
Culture: NO GROWTH
Culture: NO GROWTH
SPECIAL REQUESTS: ADEQUATE
Special Requests: ADEQUATE

## 2017-10-15 LAB — AEROBIC/ANAEROBIC CULTURE (SURGICAL/DEEP WOUND): CULTURE: NO GROWTH

## 2017-10-15 LAB — AEROBIC/ANAEROBIC CULTURE W GRAM STAIN (SURGICAL/DEEP WOUND): Gram Stain: NONE SEEN

## 2017-11-05 ENCOUNTER — Telehealth: Payer: Self-pay | Admitting: General Surgery

## 2017-11-05 NOTE — Telephone Encounter (Signed)
Patient called & wanted to check that the pain she is having from her umbilical hernia surgery done 09-20-17 is normal. Please call and advise.

## 2017-11-06 NOTE — Telephone Encounter (Signed)
Called patient Saturday AM, left a message to call Monday to arrange a f/u appointment.

## 2017-11-06 NOTE — Telephone Encounter (Signed)
I have not had a chance to call the patient (I'm not in the office), but I do not see where she has had a follow up appointment from her I/D that was done on 10-09-17. Thanks.

## 2017-11-19 ENCOUNTER — Telehealth: Payer: Self-pay | Admitting: *Deleted

## 2017-11-19 NOTE — Telephone Encounter (Signed)
Left message for patient to call the office back to schedule a office visit

## 2017-11-19 NOTE — Telephone Encounter (Signed)
-----   Message from Marie MayotteJeffrey W Byrnett, MD sent at 11/17/2017  9:30 PM EDT ----- Attempted to contact patient two weeks ago to arrange post hospital visit. Please contact her to arrange for a f/u visit. Thanks

## 2017-11-23 ENCOUNTER — Telehealth: Payer: Self-pay | Admitting: *Deleted

## 2017-11-23 NOTE — Telephone Encounter (Signed)
Left another voicemail on patients cell to call the office back to schedule a f/u from surgery

## 2017-11-23 NOTE — Telephone Encounter (Signed)
-----   Message from Jeffrey W Byrnett, MD sent at 11/17/2017  9:30 PM EDT ----- Attempted to contact patient two weeks ago to arrange post hospital visit. Please contact her to arrange for a f/u visit. Thanks 

## 2018-10-20 IMAGING — CT CT ABD-PELV W/ CM
2 of 5 series · 16 of 46 positions shown, 18 images · IV contrast (iopamidol)
Comparison: Right upper quadrant abdomen ultrasound dated
06/24/2015.

CLINICAL DATA: Redness and pain at the site of an umbilical hernia
repair performed on 09/20/2017. She reports focal swelling at that
location with some purulent drainage.

EXAM:
CT ABDOMEN AND PELVIS WITH CONTRAST
TECHNIQUE: Multidetector CT imaging of the abdomen and pelvis was performed
using the standard protocol following bolus administration of
intravenous contrast.
CONTRAST:  100mL [id number] IOPAMIDOL ([id number]-[id number]) INJECTION 61%

[Series 2: routine abd/pel with · axial · 0.82mm/px · z∈[-533,-118]mm · 13 of 97 slices shown, 15 images]
[im 7/97  soft-tissue]
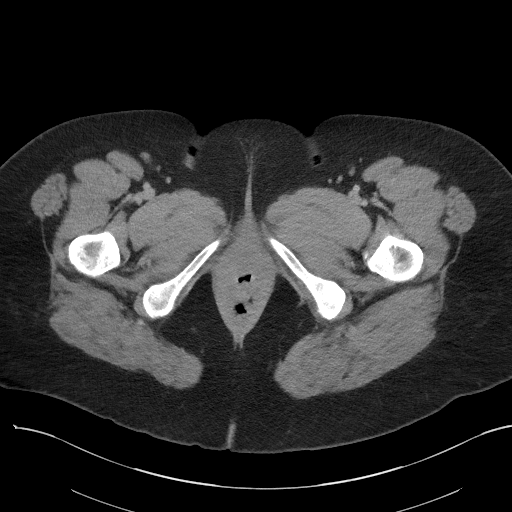
[im 7/97  bone]
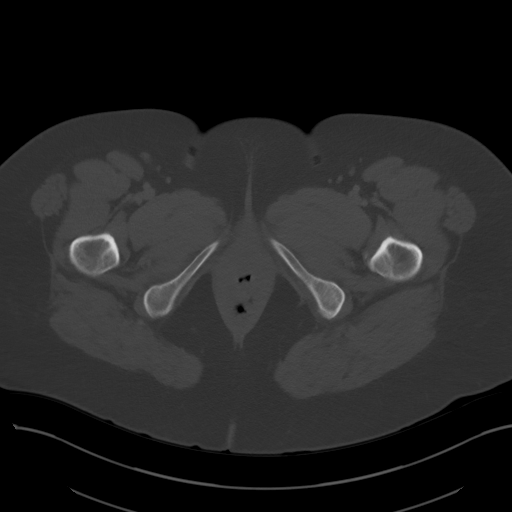
[im 13/97  soft-tissue]
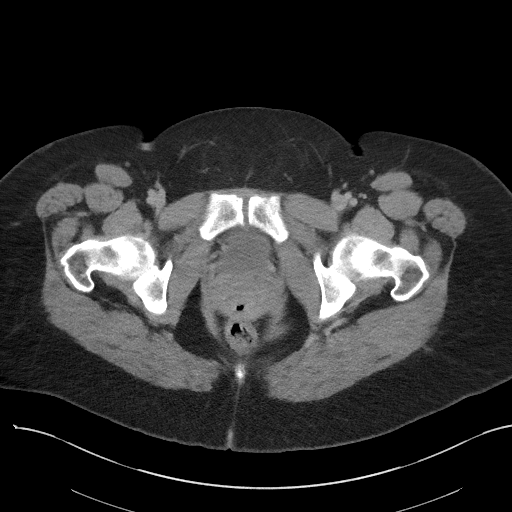
[im 20/97  soft-tissue]
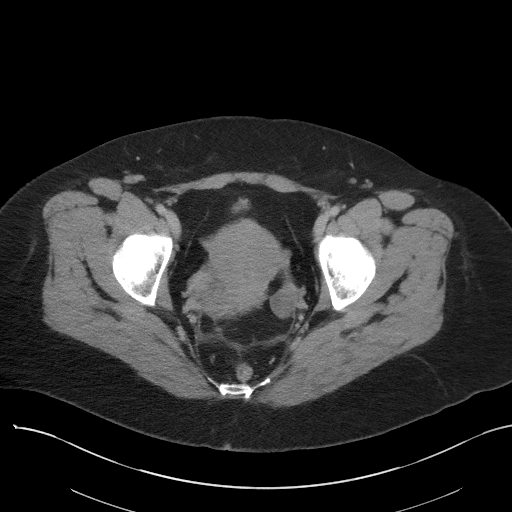
[im 26/97  soft-tissue]
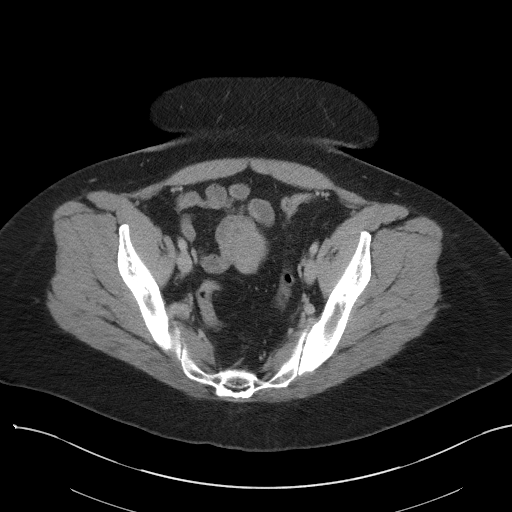
[im 33/97  soft-tissue]
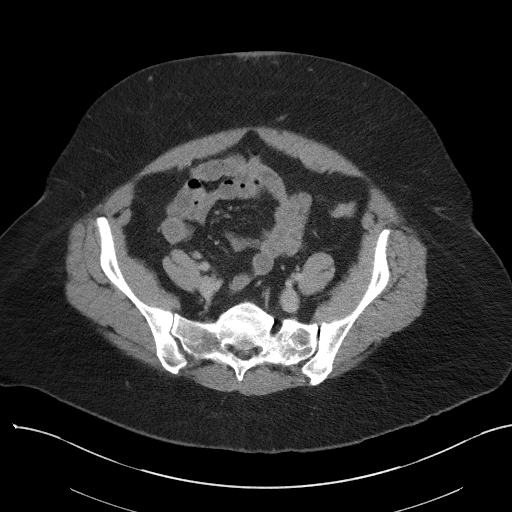
[im 39/97  soft-tissue]
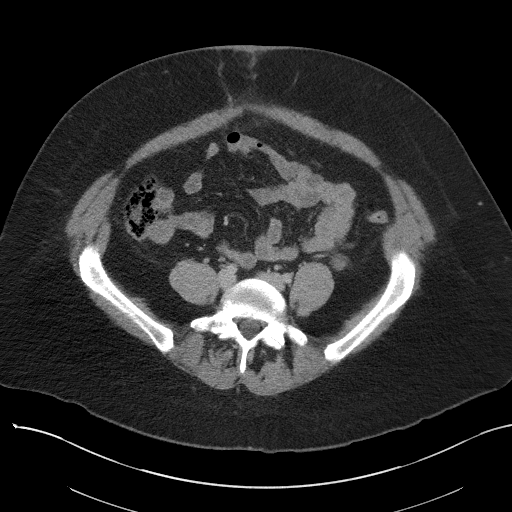
[im 52/97  soft-tissue]
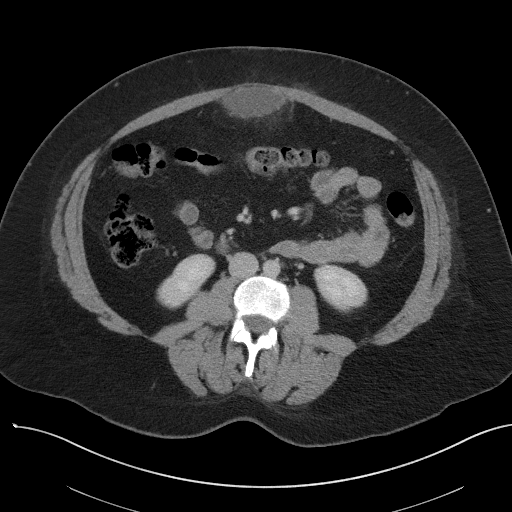
[im 58/97  soft-tissue]
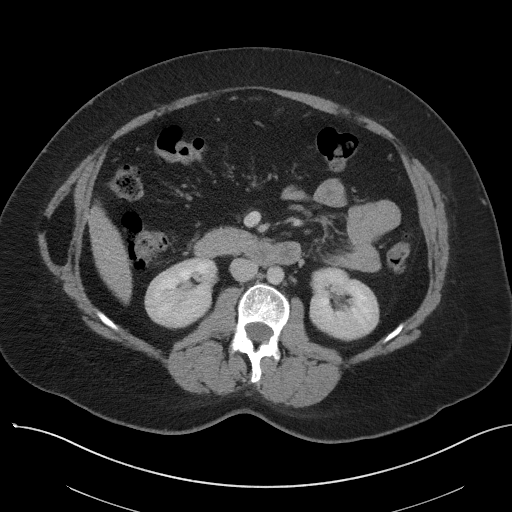
[im 65/97  soft-tissue]
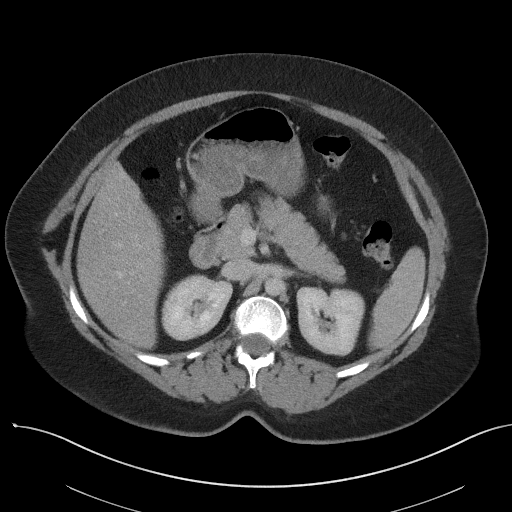
[im 65/97  bone]
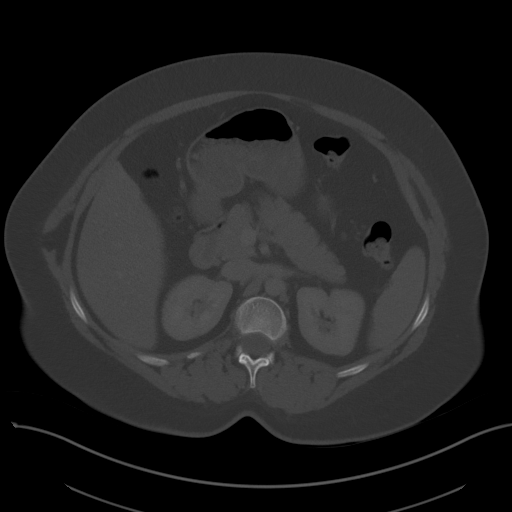
[im 71/97  soft-tissue]
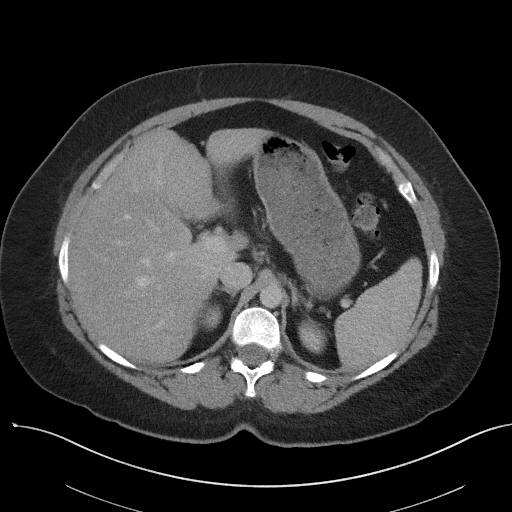
[im 77/97  soft-tissue]
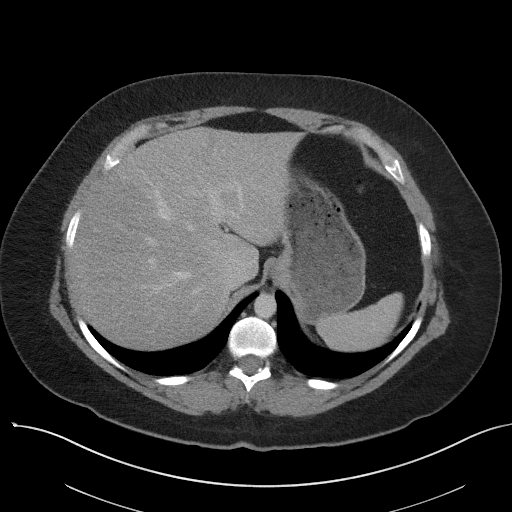
[im 84/97  soft-tissue]
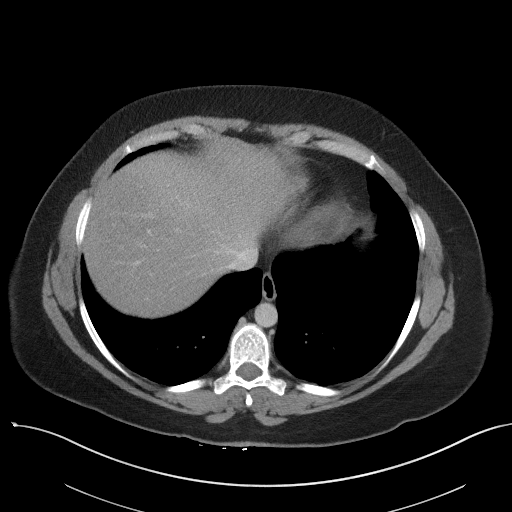
[im 90/97  soft-tissue]
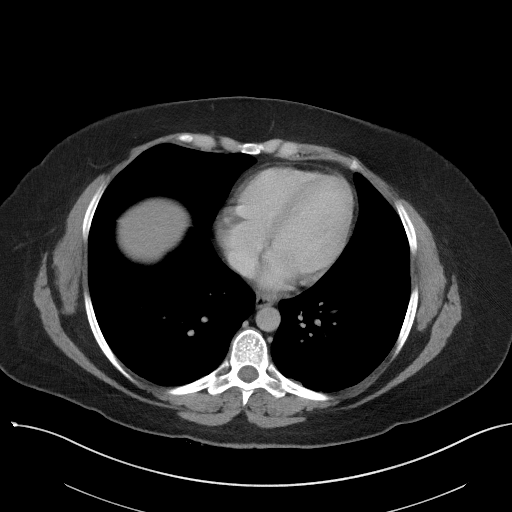

[Series 5: coronal st · coronal · 0.97mm/px · 3 of 109 slices shown]
[im 37/109  soft-tissue]
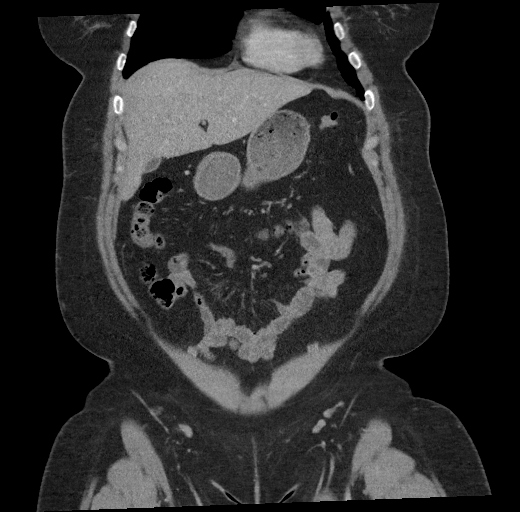
[im 49/109  soft-tissue]
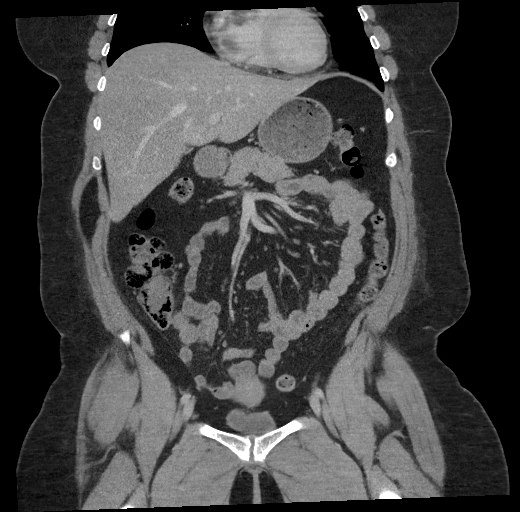
[im 61/109  soft-tissue]
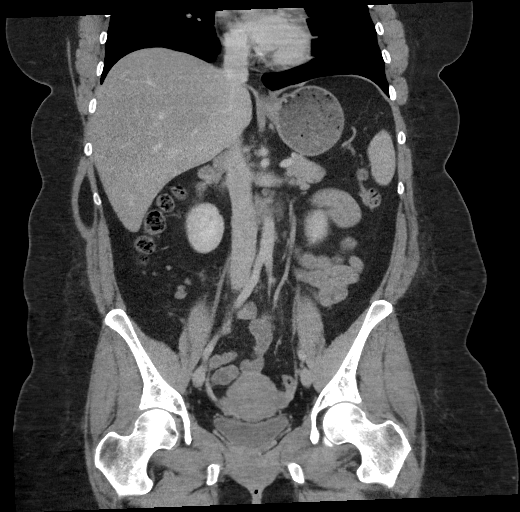

[16 of 46 positions shown; findings below may reference images not displayed]

FINDINGS: Lower chest: Unremarkable.

Hepatobiliary: Diffuse, mild geographical low density in the right
lobe of the liver. Poorly distended, normal appearing gallbladder.

Pancreas: Unremarkable. No pancreatic ductal dilatation or
surrounding inflammatory changes.

Spleen: Normal in size without focal abnormality.

Adrenals/Urinary Tract: Adrenal glands are unremarkable. Two small
lower pole left renal calculi. The largest measures 5 mm in maximum
diameter. Otherwise, the kidneys are normal, without renal calculi,
focal lesion, or hydronephrosis. Bladder is unremarkable.

Stomach/Bowel: Unremarkable stomach, small bowel and colon. No
evidence of appendicitis.

Vascular/Lymphatic: No significant vascular findings are present. No
enlarged abdominal or pelvic lymph nodes.

Reproductive: Uterus and bilateral adnexa are unremarkable.

Other: Fluid collection in the posterior aspect of the umbilicus
measuring 2.0 x 1.4 x 0.8 cm. This has a small communication with an
underlying fluid collection beneath the anterior abdominal wall,
measuring 5.9 x 5.9 x 3.1 cm. This has a thin surrounding mildly
enhancing wall. There is also adjacent stranding in the anterior
abdominal fat.

Musculoskeletal: Mild lower thoracic spine degenerative changes.
IMPRESSION: 1. 2.0 x 1.4 x 0.8 cm fluid collection in the posterior aspect of
the umbilicus, communicating with an anterior abdominal fluid
collection measuring 5.9 x 5.9 x 3.1 cm. These have CT appearances
compatible with abscess.
2. Two small, nonobstructing lower pole left renal calculi.
3. Diffuse right lobe hepatic steatosis.
